# Patient Record
Sex: Male | Born: 1965 | Race: White | Hispanic: No | Marital: Single | Smoking: Former smoker
Health system: Southern US, Community
[De-identification: ages and names within clinical notes are randomized; demographics above are authoritative.]

## PROBLEM LIST (undated history)

## (undated) DIAGNOSIS — I509 Heart failure, unspecified: Secondary | ICD-10-CM

## (undated) DIAGNOSIS — I10 Essential (primary) hypertension: Secondary | ICD-10-CM

## (undated) DIAGNOSIS — I513 Intracardiac thrombosis, not elsewhere classified: Secondary | ICD-10-CM

## (undated) HISTORY — PX: CARDIAC CATHETERIZATION: SHX172

## (undated) HISTORY — DX: Intracardiac thrombosis, not elsewhere classified: I51.3

## (undated) HISTORY — DX: Heart failure, unspecified: I50.9

## (undated) HISTORY — DX: Essential (primary) hypertension: I10

---

## 2017-10-27 ENCOUNTER — Other Ambulatory Visit: Payer: Self-pay

## 2017-10-27 ENCOUNTER — Emergency Department: Payer: Self-pay

## 2017-10-27 ENCOUNTER — Inpatient Hospital Stay
Admission: EM | Admit: 2017-10-27 | Discharge: 2017-10-29 | DRG: 286 | Disposition: A | Discharge status: Home / Self Care | Payer: Self-pay | Attending: Internal Medicine | Admitting: Internal Medicine

## 2017-10-27 DIAGNOSIS — J810 Acute pulmonary edema: Secondary | ICD-10-CM

## 2017-10-27 DIAGNOSIS — I509 Heart failure, unspecified: Secondary | ICD-10-CM

## 2017-10-27 DIAGNOSIS — I420 Dilated cardiomyopathy: Secondary | ICD-10-CM | POA: Diagnosis present

## 2017-10-27 DIAGNOSIS — I081 Rheumatic disorders of both mitral and tricuspid valves: Secondary | ICD-10-CM | POA: Diagnosis present

## 2017-10-27 DIAGNOSIS — I513 Intracardiac thrombosis, not elsewhere classified: Secondary | ICD-10-CM | POA: Diagnosis present

## 2017-10-27 DIAGNOSIS — R188 Other ascites: Secondary | ICD-10-CM

## 2017-10-27 DIAGNOSIS — I5021 Acute systolic (congestive) heart failure: Secondary | ICD-10-CM | POA: Diagnosis present

## 2017-10-27 DIAGNOSIS — E785 Hyperlipidemia, unspecified: Secondary | ICD-10-CM | POA: Diagnosis present

## 2017-10-27 DIAGNOSIS — I110 Hypertensive heart disease with heart failure: Principal | ICD-10-CM | POA: Diagnosis present

## 2017-10-27 DIAGNOSIS — R000 Tachycardia, unspecified: Secondary | ICD-10-CM | POA: Diagnosis present

## 2017-10-27 DIAGNOSIS — Z8249 Family history of ischemic heart disease and other diseases of the circulatory system: Secondary | ICD-10-CM

## 2017-10-27 LAB — CBC WITH DIFFERENTIAL/PLATELET
Basophils Absolute: 0.1 10*3/uL (ref 0–0.1)
Basophils Relative: 1 %
EOS PCT: 3 %
Eosinophils Absolute: 0.2 10*3/uL (ref 0–0.7)
HEMATOCRIT: 47.8 % (ref 40.0–52.0)
Hemoglobin: 16 g/dL (ref 13.0–18.0)
LYMPHS ABS: 2.1 10*3/uL (ref 1.0–3.6)
LYMPHS PCT: 26 %
MCH: 30.2 pg (ref 26.0–34.0)
MCHC: 33.4 g/dL (ref 32.0–36.0)
MCV: 90.4 fL (ref 80.0–100.0)
MONO ABS: 0.5 10*3/uL (ref 0.2–1.0)
Monocytes Relative: 7 %
NEUTROS PCT: 63 %
Neutro Abs: 5 10*3/uL (ref 1.4–6.5)
PLATELETS: 313 10*3/uL (ref 150–440)
RBC: 5.29 MIL/uL (ref 4.40–5.90)
RDW: 14.9 % — AB (ref 11.5–14.5)
WBC: 7.9 10*3/uL (ref 3.8–10.6)

## 2017-10-27 LAB — URINALYSIS, COMPLETE (UACMP) WITH MICROSCOPIC
BACTERIA UA: NONE SEEN
BILIRUBIN URINE: NEGATIVE
Glucose, UA: NEGATIVE mg/dL
Hgb urine dipstick: NEGATIVE
Ketones, ur: NEGATIVE mg/dL
Leukocytes, UA: NEGATIVE
NITRITE: NEGATIVE
PH: 6 (ref 5.0–8.0)
Protein, ur: NEGATIVE mg/dL
SPECIFIC GRAVITY, URINE: 1.019 (ref 1.005–1.030)
Squamous Epithelial / LPF: NONE SEEN (ref 0–5)

## 2017-10-27 LAB — COMPREHENSIVE METABOLIC PANEL
ALK PHOS: 104 U/L (ref 38–126)
ALT: 19 U/L (ref 17–63)
AST: 31 U/L (ref 15–41)
Albumin: 4.2 g/dL (ref 3.5–5.0)
Anion gap: 8 (ref 5–15)
BUN: 7 mg/dL (ref 6–20)
CALCIUM: 8.6 mg/dL — AB (ref 8.9–10.3)
CHLORIDE: 97 mmol/L — AB (ref 101–111)
CO2: 28 mmol/L (ref 22–32)
CREATININE: 1.11 mg/dL (ref 0.61–1.24)
Glucose, Bld: 116 mg/dL — ABNORMAL HIGH (ref 65–99)
Potassium: 4.2 mmol/L (ref 3.5–5.1)
Sodium: 133 mmol/L — ABNORMAL LOW (ref 135–145)
Total Bilirubin: 1.8 mg/dL — ABNORMAL HIGH (ref 0.3–1.2)
Total Protein: 7.5 g/dL (ref 6.5–8.1)

## 2017-10-27 LAB — PROTIME-INR
INR: 1.25
Prothrombin Time: 15.6 seconds — ABNORMAL HIGH (ref 11.4–15.2)

## 2017-10-27 LAB — LIPASE, BLOOD: Lipase: 28 U/L (ref 11–51)

## 2017-10-27 LAB — BRAIN NATRIURETIC PEPTIDE: B Natriuretic Peptide: 1971 pg/mL — ABNORMAL HIGH (ref 0.0–100.0)

## 2017-10-27 MED ORDER — MORPHINE SULFATE (PF) 4 MG/ML IV SOLN
4.0000 mg | Freq: Once | INTRAVENOUS | Status: AC
  Administered 2017-10-27: 4 mg via INTRAVENOUS
  Filled 2017-10-27: qty 1

## 2017-10-27 MED ORDER — IOPAMIDOL (ISOVUE-300) INJECTION 61%
30.0000 mL | Freq: Once | INTRAVENOUS | Status: AC | PRN
  Administered 2017-10-27: 30 mL via ORAL

## 2017-10-27 MED ORDER — SODIUM CHLORIDE 0.9 % IV BOLUS
1000.0000 mL | Freq: Once | INTRAVENOUS | Status: AC
  Administered 2017-10-27: 1000 mL via INTRAVENOUS

## 2017-10-27 MED ORDER — IOPAMIDOL (ISOVUE-300) INJECTION 61%: 15.0000 mL | INTRAVENOUS | Status: DC

## 2017-10-27 MED ORDER — FUROSEMIDE 10 MG/ML IJ SOLN
40.0000 mg | Freq: Once | INTRAMUSCULAR | Status: AC
  Administered 2017-10-27: 40 mg via INTRAVENOUS
  Filled 2017-10-27: qty 4

## 2017-10-27 MED ORDER — MORPHINE SULFATE (PF) 4 MG/ML IV SOLN
4.0000 mg | Freq: Once | INTRAVENOUS | Status: AC
  Administered 2017-10-27 (×2): 4 mg via INTRAVENOUS
  Filled 2017-10-27: qty 1

## 2017-10-27 MED ORDER — ONDANSETRON HCL 4 MG/2ML IJ SOLN
4.0000 mg | Freq: Once | INTRAMUSCULAR | Status: AC
  Administered 2017-10-27: 4 mg via INTRAVENOUS
  Filled 2017-10-27: qty 2

## 2017-10-27 MED ORDER — MORPHINE SULFATE (PF) 4 MG/ML IV SOLN
INTRAVENOUS | Status: AC
  Administered 2017-10-27: 4 mg via INTRAVENOUS
  Filled 2017-10-27: qty 1

## 2017-10-27 MED ORDER — IOPAMIDOL (ISOVUE-370) INJECTION 76%
75.0000 mL | Freq: Once | INTRAVENOUS | Status: AC | PRN
  Administered 2017-10-27: 75 mL via INTRAVENOUS

## 2017-10-27 NOTE — ED Notes (Signed)
Report given to Lisa RN

## 2017-10-27 NOTE — ED Notes (Signed)
CT called and informed that patient has finished contrast drink.

## 2017-10-27 NOTE — ED Triage Notes (Signed)
Pt c/o abd pain that radiated into the right side - the pain started 2-4 days ago - he reports the most severe pain is around is umbilicus - pt last had BM today - he reports decreased appetite because "I am not processing it out"

## 2017-10-27 NOTE — H&P (Addendum)
[facility] [place] [facility]   PATIENT NAME: Austin Mckenzie    MR#:  [id number]  DATE OF BIRTH:  1965/12/11  DATE OF ADMISSION:  10/27/2017  PRIMARY CARE PHYSICIAN: Patient, No Pcp Per   REQUESTING/REFERRING PHYSICIAN:   CHIEF COMPLAINT:   Chief Complaint  Patient presents with  . Abdominal Pain    HISTORY OF PRESENT ILLNESS: Kendarious Gudino  is a 52 y.o. male without significant past medical history, not on any medications. He presented to emergency room for abdominal pain, described as constant diffuse pressure, 6 out of 10 in severity, worse around the umbilical area and left lower quadrant.  He reports some associated occasional nausea and loose stools, no vomiting.  No fever or chills, no bleeding.  His symptoms have been going on for the past 2 weeks, gradually getting worse.  Patient cannot identify any factors that could improve or worsen his symptoms. On detailed questioning, he admits to shortness of breath with exertion going on for the past 2 months or so.  Denies chest pain, cough, or lower extremities edema. Blood test done emergency room are remarkable for sodium level of 133, creatinine level of 1.11, glucose level 116, BNP 1,971.  UA is negative for UTI.  First troponin level was elevated at 0.24.  EKG, reviewed by myself, shows sinus tachycardia with heart rate at 103; normal axis and nonspecific ST changes. Chest x-ray, reviewed by myself, shows pulmonary edema.  CT scan of the abdomen, reviewed by myself, shows ascites. At the arrival to emergency room, his blood pressure was elevated at 179/136 and heart rate was elevated at 113.  Patient is admitted for further evaluation and treatment.    PAST MEDICAL HISTORY:  History reviewed. No pertinent past medical history.  PAST SURGICAL HISTORY: History reviewed. No pertinent surgical history.  SOCIAL HISTORY:  Social History   Tobacco Use  . Smoking status: Never Smoker  . Smokeless  tobacco: Never Used  Substance Use Topics  . Alcohol use: Yes    Alcohol/week: 3.6 oz    Types: 6 Cans of beer per week    FAMILY HISTORY: Hypertension in mother.  DRUG ALLERGIES: No Known Allergies  REVIEW OF SYSTEMS:   CONSTITUTIONAL: No fever, but positive for fatigue.  EYES: No blurred or double vision.  EARS, NOSE, AND THROAT: No tinnitus or ear pain.  RESPIRATORY: No cough, wheezing or hemoptysis.  Positive for shortness of breath with exertion. CARDIOVASCULAR: No chest pain, orthopnea, edema.  GASTROINTESTINAL: Positive for abdominal pain and occasional nausea, no vomiting.  GENITOURINARY: No dysuria, hematuria.  ENDOCRINE: No polyuria, nocturia,  HEMATOLOGY: No bleeding SKIN: No rash or lesion. MUSCULOSKELETAL: No joint pain.   NEUROLOGIC: No focal weakness.  PSYCHIATRY: No anxiety or depression.   MEDICATIONS AT HOME:  Prior to Admission medications   Not on File      PHYSICAL EXAMINATION:   VITAL SIGNS: Blood pressure (!) 152/117, pulse (!) 101, temperature 98.2 F (36.8 C), temperature source Oral, resp. rate 13, height  (1.727 m), weight 72.6 kg (160 lb), SpO2 (!) 79 %.  GENERAL:  52 y.o.-year-old patient lying in the bed with mild distress, secondary to abdominal pain.  EYES: Pupils equal, round, reactive to light and accommodation. No scleral icterus. Extraocular muscles intact.  HEENT: Head atraumatic, normocephalic. Oropharynx and nasopharynx clear.  NECK:  Supple, no jugular venous distention. No thyroid enlargement, no tenderness.  LUNGS: Reduced breath sounds bilaterally.  Bibasilar crackles are heard.  No  use of accessory muscles of respiration.  CARDIOVASCULAR: S1, S2 normal. No S3/S4.  ABDOMEN: Soft, but mildly distended and tender with palpation around the umbilical area and left lower quadrant. Bowel sounds present. No organomegaly or mass appreciated.  EXTREMITIES: No pedal edema, cyanosis, or clubbing.  NEUROLOGIC: No focal weakness.   PSYCHIATRIC: The patient is alert and oriented x 3.  SKIN: No obvious rash, lesion, or ulcer.   LABORATORY PANEL:   CBC Recent Labs  Lab 10/27/17 1647  WBC 7.9  HGB 16.0  HCT 47.8  PLT 313  MCV 90.4  MCH 30.2  MCHC 33.4  RDW 14.9*  LYMPHSABS 2.1  MONOABS 0.5  EOSABS 0.2  BASOSABS 0.1   ------------------------------------------------------------------------------------------------------------------  Chemistries  Recent Labs  Lab 10/27/17 1647  NA 133*  K 4.2  CL 97*  CO2 28  GLUCOSE 116*  BUN 7  CREATININE 1.11  CALCIUM 8.6*  AST 31  ALT 19  ALKPHOS 104  BILITOT 1.8*   ------------------------------------------------------------------------------------------------------------------ estimated creatinine clearance is 75.3 mL/min (by C-G formula based on SCr of 1.11 mg/dL). ------------------------------------------------------------------------------------------------------------------ No results for input(s): TSH, T4TOTAL, T3FREE, THYROIDAB in the last 72 hours.  Invalid input(s): FREET3   Coagulation profile Recent Labs  Lab 10/27/17 2038  INR 1.25   ------------------------------------------------------------------------------------------------------------------- No results for input(s): DDIMER in the last 72 hours. -------------------------------------------------------------------------------------------------------------------  Cardiac Enzymes No results for input(s): CKMB, TROPONINI, MYOGLOBIN in the last 168 hours.  Invalid input(s): CK ------------------------------------------------------------------------------------------------------------------ Invalid input(s): POCBNP  ---------------------------------------------------------------------------------------------------------------  Urinalysis    Component Value Date/Time   COLORURINE STRAW (A) 10/27/2017 1647   APPEARANCEUR CLEAR (A) 10/27/2017 1647   LABSPEC 1.019 10/27/2017 1647    PHURINE 6.0 10/27/2017 1647   GLUCOSEU NEGATIVE 10/27/2017 1647   HGBUR NEGATIVE 10/27/2017 1647   BILIRUBINUR NEGATIVE 10/27/2017 1647   KETONESUR NEGATIVE 10/27/2017 1647   PROTEINUR NEGATIVE 10/27/2017 1647   NITRITE NEGATIVE 10/27/2017 1647   LEUKOCYTESUR NEGATIVE 10/27/2017 1647     RADIOLOGY: Dg Chest 2 View  Result Date: 10/27/2017 CLINICAL DATA:  Shortness of breath and abdominal pain EXAM: CHEST - 2 VIEW COMPARISON:  None. FINDINGS: Cardiomegaly with diffuse interstitial opacities, likely pulmonary interstitial edema. No pneumothorax. Small right pleural effusion. No focal airspace consolidation. IMPRESSION: Cardiomegaly and mild interstitial pulmonary edema. Electronically Signed   By: Deatra Robinson M.D.   On: 10/27/2017 20:16   Ct Abdomen Pelvis W Contrast  Result Date: 10/27/2017 CLINICAL DATA:  Abdominal pain radiating to right side. Periumbilical pain. EXAM: CT ABDOMEN AND PELVIS WITH CONTRAST TECHNIQUE: Multidetector CT imaging of the abdomen and pelvis was performed using the standard protocol following bolus administration of intravenous contrast. CONTRAST:  75mL ISOVUE-370 IOPAMIDOL (ISOVUE-370) INJECTION 76% COMPARISON:  None. FINDINGS: LOWER CHEST: Small right pleural effusion with associated atelectasis. HEPATOBILIARY: Small volume perihepatic ascites. No discrete liver lesion. Normal gallbladder. PANCREAS: Normal parenchymal contours without ductal dilatation. No peripancreatic fluid collection. SPLEEN: Normal. ADRENALS/URINARY TRACT: --Adrenal glands: Normal. --Right kidney/ureter: No hydronephrosis, nephroureterolithiasis, perinephric stranding or solid renal mass. --Left kidney/ureter: No hydronephrosis, nephroureterolithiasis, perinephric stranding or solid renal mass. --Urinary bladder: There is diffuse urinary bladder wall thickening with surrounding inflammatory stranding in the extraperitoneal space. STOMACH/BOWEL: --Stomach/Duodenum: No hiatal hernia or other gastric  abnormality. Normal duodenal course. --Small bowel: No dilatation or inflammation. --Colon: No focal abnormality. --Appendix: There is moderate volume fluid in the right paracolic gutter and right lower quadrant. The appendix is not clearly visible, but appears to be of normal caliber, though surrounded by fluid. No free  intraperitoneal air. VASCULAR/LYMPHATIC: Atherosclerotic calcification is present within the non-aneurysmal abdominal aorta, without hemodynamically significant stenosis. No abdominal or pelvic lymphadenopathy. REPRODUCTIVE: No free fluid in the pelvis. MUSCULOSKELETAL. No bony spinal canal stenosis or focal osseous abnormality. T11 hemangioma. OTHER: None. IMPRESSION: 1. Moderate volume free fluid throughout the abdomen, within the perihepatic space, perisplenic space and right-greater-than-left paracolic gutters. There is also retroperitoneal fluid and stranding. The etiology of the fluid is unclear. In the context of moderate thickening of the urinary bladder with surrounding inflammatory change, acute cystitis is a primary consideration. Hypoalbuminemia or chronic liver disease might also account for the intraperitoneal fluid. No focal small bowel or colonic abnormality to suggest gastrointestinal origin. There is a relatively dense stool ball in the rectum, but the surrounding perirectal fat is normal. 2. Small right pleural effusion. Electronically Signed   By: Deatra Robinson M.D.   On: 10/27/2017 19:45    EKG: Orders placed or performed during the hospital encounter of 10/27/17  . ED EKG  . ED EKG  . EKG 12-Lead  . EKG 12-Lead    IMPRESSION AND PLAN:  1.  Acute pulmonary edema.  We will start patient on Lasix IV.  Will check 2D echo.  Continue to monitor on telemetry. 2. NSTEMI.  First troponin level is elevated at 0.24.  This could be related to pulmonary edema.  Patient is currently chest pain-free.  We will continue to monitor patient on telemetry and follow troponin levels,  to rule out ACS.  Will start patient on aspirin. 3.  CHF, likely secondary to uncontrolled hypertension.  Will check 2D echo for further evaluation.  Will consult cardiology for further evaluation and treatment. 4.  Hypertension, new diagnosis.  Patient is not on any medications for this.  Will start lisinopril and Lasix.  We will plan to add Coreg as the next agent. 5.  Ascites, likely secondary to CHF.  Will start patient on IV Lasix and continue to monitor clinically closely. 6.  Abdominal pain likely, secondary to ascites, from CHF.  We will continue supportive treatment with pain medication while treating the underlying disorder.   All the records are reviewed and case discussed with ED provider. Management plans discussed with the patient, and he is in agreement.  CODE STATUS: FULL    TOTAL TIME TAKING CARE OF THIS PATIENT: 45 minutes.    Cammy Copa M.D on 10/27/2017 at 11:28 PM  Between 7am to 6pm - Pager - [phone]  After 6pm go to [email or web address] - password [email or web address]  [facility]  Office  [phone]  CC: Primary care physician; Patient, No Pcp Per

## 2017-10-27 NOTE — ED Provider Notes (Addendum)
[facility] Emergency Department Provider Note ____________________________________________   First MD Initiated Contact with Patient 10/27/17 1807     (approximate)  I have reviewed the triage vital signs and the nursing notes.   HISTORY  Chief Complaint Abdominal Pain    HPI Austin Mckenzie is a 52 y.o. male with no significant past medical history who presents with abdominal pain for the last 2 weeks, initially intermittent, but now constant and worsening over the last 2 to 3 days.  He reports that the pain is somewhat crampy and diffuse but worse in the left lower quadrant and around his umbilicus.  He reports some nausea but no vomiting, and he reports some loose stools.  No prior history of this pain.   History reviewed. No pertinent past medical history.  There are no active problems to display for this patient.   History reviewed. No pertinent surgical history.  Prior to Admission medications   Not on File    Allergies Patient has no known allergies.  No family history on file.  Social History Social History   Tobacco Use  . Smoking status: Never Smoker  . Smokeless tobacco: Never Used  Substance Use Topics  . Alcohol use: Yes    Alcohol/week: 3.6 oz    Types: 6 Cans of beer per week  . Drug use: Never    Review of Systems  Constitutional: No fever. Eyes: No redness. ENT: No sore throat. Cardiovascular: Denies chest pain. Respiratory: Positive for mild shortness of breath. Gastrointestinal: Positive for nausea, no vomiting. Genitourinary: Negative for dysuria or hematuria.  Musculoskeletal: Negative for back pain. Skin: Negative for rash. Neurological: Negative for headache.   ____________________________________________   PHYSICAL EXAM:  VITAL SIGNS: ED Triage Vitals  Enc Vitals Group     BP 10/27/17 1648 (!) 165/120     Pulse Rate 10/27/17 1648 (!) 113     Resp 10/27/17 1648 20     Temp 10/27/17 1648 98.2 F  (36.8 C)     Temp Source 10/27/17 1648 Oral     SpO2 10/27/17 1648 97 %     Weight 10/27/17 1646 160 lb (72.6 kg)     Height 10/27/17 1646  (1.727 m)     Head Circumference --      Peak Flow --      Pain Score 10/27/17 1645 9     Pain Loc --      Pain Edu? --      Excl. in GC? --     Constitutional: Alert and oriented.  Slightly uncomfortable appearing but in no acute distress. Eyes: Conjunctivae are normal.  No scleral icterus. Head: Atraumatic. Nose: No congestion/rhinnorhea. Mouth/Throat: Mucous membranes are somewhat dry. Neck: Normal range of motion.  Cardiovascular:  Good peripheral circulation. Respiratory: Normal respiratory effort.  No retractions. Lungs CTAB. Gastrointestinal: Soft with mild to moderate left lower quadrant and periumbilical tenderness.  Somewhat distended.  Genitourinary: No flank tenderness. Musculoskeletal: No lower extremity edema.  Extremities warm and well perfused.  Neurologic:  Normal speech and language. No gross focal neurologic deficits are appreciated.  Skin:  Skin is warm and dry. No rash noted. Psychiatric: Mood and affect are normal. Speech and behavior are normal.  ____________________________________________   LABS (all labs ordered are listed, but only abnormal results are displayed)  Labs Reviewed  COMPREHENSIVE METABOLIC PANEL - Abnormal; Notable for the following components:      Result Value   Sodium 133 (*)  Chloride 97 (*)    Glucose, Bld 116 (*)    Calcium 8.6 (*)    Total Bilirubin 1.8 (*)    All other components within normal limits  URINALYSIS, COMPLETE (UACMP) WITH MICROSCOPIC - Abnormal; Notable for the following components:   Color, Urine STRAW (*)    APPearance CLEAR (*)    All other components within normal limits  CBC WITH DIFFERENTIAL/PLATELET - Abnormal; Notable for the following components:   RDW 14.9 (*)    All other components within normal limits  PROTIME-INR - Abnormal; Notable for the  following components:   Prothrombin Time 15.6 (*)    All other components within normal limits  BRAIN NATRIURETIC PEPTIDE - Abnormal; Notable for the following components:   B Natriuretic Peptide 1,971.0 (*)    All other components within normal limits  LIPASE, BLOOD  TROPONIN I   ____________________________________________  EKG  ED ECG REPORT I, Dionne Bucy, the attending physician, personally viewed and interpreted this ECG.  Date: 10/27/2017 EKG Time: 2239 Rate: 103 Rhythm: Sinus tachycardia QRS Axis: normal Intervals: Borderline prolonged QT ST/T Wave abnormalities: Lateral T wave inversions Narrative Interpretation: Nonspecific ST changes  ____________________________________________  RADIOLOGY  CXR: Cardiomegaly with interstitial pulmonary edema CT abdomen: Free fluid in the abdomen with no other acute findings  ____________________________________________   PROCEDURES  Procedure(s) performed: No  Procedures  Critical Care performed: No ____________________________________________   INITIAL IMPRESSION / ASSESSMENT AND PLAN / ED COURSE  Pertinent labs & imaging results that were available during my care of the patient were reviewed by me and considered in my medical decision making (see chart for details).  52 year old male with no significant past medical history presents with primarily periumbilical and left lower quadrant abdominal pain over the last 2 weeks but worse in the last several days.  No prior history of this pain.  On exam, the patient is slightly tachycardic and hypertensive.  The other vital signs are normal.  He is uncomfortable but not acutely ill-appearing.  The remainder of the exam is as described above with some abdominal distention and tenderness.  Differential includes diverticulitis, colitis, appendicitis, bowel obstruction, pyelonephritis, or intra-abdominal malignancy.  Plan: IV fluids, analgesia, labs, and CT  abdomen.    ----------------------------------------- 10:22 PM on 10/27/2017 -----------------------------------------  CT revealed some free fluid in the abdomen but no other obvious etiology of the patient's pain.  I consulted Dr. Elenor Legato from general surgery who also reviewed the images and discussed the clinical presentation with me and confirmed that he felt that there was no evidence of a surgical etiology but likely ascites secondary to some other primary cause.  Patient's chest x-ray showed cardiomegaly and pulmonary interstitial edema, and his BNP is significantly elevated.  Therefore, the abdominal distention and ascites are most likely secondary to CHF.  We have given IV Lasix and added on troponin and EKG.  I had an extensive discussion with the patient his family members about the results of the work-up.  I will admit the patient for work-up and treatment of likely new onset CHF.  There is no clinical evidence of ACS at this time.  I signed the patient out to the hospitalist Dr. Caryn Bee.  ____________________________________________   FINAL CLINICAL IMPRESSION(S) / ED DIAGNOSES  Final diagnoses:  Other ascites  Acute pulmonary edema (HCC)      NEW MEDICATIONS STARTED DURING THIS VISIT:  New Prescriptions   No medications on file     Note:  This document was prepared using  [employer] and may include unintentional dictation errors.      Dionne Bucy, MD 10/27/17 2242

## 2017-10-28 ENCOUNTER — Encounter
Admission: EM | Disposition: A | Discharge status: Home / Self Care | Payer: Self-pay | Source: Home / Self Care | Attending: Internal Medicine

## 2017-10-28 ENCOUNTER — Inpatient Hospital Stay
Admit: 2017-10-28 | Discharge: 2017-10-28 | Disposition: A | Discharge status: Home / Self Care | Payer: Self-pay | Attending: Internal Medicine | Admitting: Internal Medicine

## 2017-10-28 ENCOUNTER — Encounter: Payer: Self-pay | Admitting: Internal Medicine

## 2017-10-28 HISTORY — PX: LEFT HEART CATH AND CORONARY ANGIOGRAPHY: CATH118249

## 2017-10-28 LAB — CBC
HEMATOCRIT: 47.7 % (ref 40.0–52.0)
Hemoglobin: 16.1 g/dL (ref 13.0–18.0)
MCH: 30.4 pg (ref 26.0–34.0)
MCHC: 33.7 g/dL (ref 32.0–36.0)
MCV: 90.3 fL (ref 80.0–100.0)
Platelets: 305 10*3/uL (ref 150–440)
RBC: 5.29 MIL/uL (ref 4.40–5.90)
RDW: 14.9 % — ABNORMAL HIGH (ref 11.5–14.5)
WBC: 7.6 10*3/uL (ref 3.8–10.6)

## 2017-10-28 LAB — ECHOCARDIOGRAM COMPLETE
Height: 68 in
WEIGHTICAEL: 2643.2 [oz_av]

## 2017-10-28 LAB — GLUCOSE, CAPILLARY: GLUCOSE-CAPILLARY: 105 mg/dL — AB (ref 65–99)

## 2017-10-28 LAB — BASIC METABOLIC PANEL
Anion gap: 10 (ref 5–15)
BUN: 8 mg/dL (ref 6–20)
CHLORIDE: 97 mmol/L — AB (ref 101–111)
CO2: 30 mmol/L (ref 22–32)
Calcium: 8.5 mg/dL — ABNORMAL LOW (ref 8.9–10.3)
Creatinine, Ser: 1.05 mg/dL (ref 0.61–1.24)
GFR calc non Af Amer: >60 mL/min (ref >60)
Glucose, Bld: 105 mg/dL — ABNORMAL HIGH (ref 65–99)
POTASSIUM: 3.5 mmol/L (ref 3.5–5.1)
Sodium: 137 mmol/L (ref 135–145)

## 2017-10-28 LAB — TROPONIN I: TROPONIN I: 0.24 ng/mL — AB (ref <0.03)

## 2017-10-28 SURGERY — LEFT HEART CATH AND CORONARY ANGIOGRAPHY
Anesthesia: Moderate Sedation

## 2017-10-28 MED ORDER — LISINOPRIL 5 MG PO TABS
5.0000 mg | ORAL_TABLET | Freq: Every day | ORAL | Status: DC
  Administered 2017-10-28: 5 mg via ORAL
  Filled 2017-10-28: qty 1

## 2017-10-28 MED ORDER — ASPIRIN EC 325 MG PO TBEC
325.0000 mg | DELAYED_RELEASE_TABLET | Freq: Every day | ORAL | Status: DC
  Administered 2017-10-28 – 2017-10-29 (×2): 325 mg via ORAL
  Filled 2017-10-28 (×2): qty 1

## 2017-10-28 MED ORDER — ATORVASTATIN CALCIUM 20 MG PO TABS
40.0000 mg | ORAL_TABLET | Freq: Every day | ORAL | Status: DC
  Administered 2017-10-28: 40 mg via ORAL
  Filled 2017-10-28: qty 2

## 2017-10-28 MED ORDER — SODIUM CHLORIDE 0.9 % WEIGHT BASED INFUSION
1.0000 mL/kg/h | INTRAVENOUS | Status: DC
  Administered 2017-10-28: 1 mL/kg/h via INTRAVENOUS

## 2017-10-28 MED ORDER — HYDRALAZINE HCL 20 MG/ML IJ SOLN
10.0000 mg | Freq: Four times a day (QID) | INTRAMUSCULAR | Status: DC | PRN
  Administered 2017-10-28: 10 mg via INTRAVENOUS
  Filled 2017-10-28: qty 1

## 2017-10-28 MED ORDER — MIDAZOLAM HCL 2 MG/2ML IJ SOLN
INTRAMUSCULAR | Status: AC
  Filled 2017-10-28: qty 2

## 2017-10-28 MED ORDER — ACETAMINOPHEN 325 MG PO TABS: 650.0000 mg | ORAL_TABLET | ORAL | Status: DC | PRN

## 2017-10-28 MED ORDER — SPIRONOLACTONE 25 MG PO TABS
25.0000 mg | ORAL_TABLET | Freq: Every day | ORAL | Status: DC
Start: 2017-10-28 — End: 2017-10-29
  Administered 2017-10-28 – 2017-10-29 (×2): 25 mg via ORAL
  Filled 2017-10-28 (×2): qty 1

## 2017-10-28 MED ORDER — MIDAZOLAM HCL 2 MG/2ML IJ SOLN
INTRAMUSCULAR | Status: DC | PRN
  Administered 2017-10-28: 1 mg via INTRAVENOUS

## 2017-10-28 MED ORDER — SODIUM CHLORIDE 0.9% FLUSH: 3.0000 mL | INTRAVENOUS | Status: DC | PRN

## 2017-10-28 MED ORDER — APIXABAN 5 MG PO TABS
5.0000 mg | ORAL_TABLET | Freq: Two times a day (BID) | ORAL | Status: DC
  Administered 2017-10-28 – 2017-10-29 (×3): 5 mg via ORAL
  Filled 2017-10-28 (×3): qty 1

## 2017-10-28 MED ORDER — SODIUM CHLORIDE 0.9% FLUSH
3.0000 mL | Freq: Two times a day (BID) | INTRAVENOUS | Status: DC
  Administered 2017-10-28: 3 mL via INTRAVENOUS

## 2017-10-28 MED ORDER — LISINOPRIL 10 MG PO TABS
10.0000 mg | ORAL_TABLET | Freq: Every day | ORAL | Status: DC
  Administered 2017-10-29: 10 mg via ORAL
  Filled 2017-10-28: qty 1

## 2017-10-28 MED ORDER — HEPARIN SODIUM (PORCINE) 5000 UNIT/ML IJ SOLN
5000.0000 [IU] | Freq: Three times a day (TID) | INTRAMUSCULAR | Status: DC
  Administered 2017-10-28: 5000 [IU] via SUBCUTANEOUS
  Filled 2017-10-28: qty 1

## 2017-10-28 MED ORDER — FENTANYL CITRATE (PF) 100 MCG/2ML IJ SOLN
INTRAMUSCULAR | Status: DC | PRN
  Administered 2017-10-28: 25 ug via INTRAVENOUS

## 2017-10-28 MED ORDER — ASPIRIN 81 MG PO CHEW: 81.0000 mg | CHEWABLE_TABLET | ORAL | Status: DC

## 2017-10-28 MED ORDER — SODIUM CHLORIDE 0.9 % WEIGHT BASED INFUSION: 1.0000 mL/kg/h | INTRAVENOUS | Status: DC

## 2017-10-28 MED ORDER — ONDANSETRON HCL 4 MG/2ML IJ SOLN: 4.0000 mg | Freq: Four times a day (QID) | INTRAMUSCULAR | Status: DC | PRN

## 2017-10-28 MED ORDER — LABETALOL HCL 5 MG/ML IV SOLN
INTRAVENOUS | Status: AC
  Filled 2017-10-28: qty 4

## 2017-10-28 MED ORDER — ACETAMINOPHEN 325 MG PO TABS: 650.0000 mg | ORAL_TABLET | Freq: Four times a day (QID) | ORAL | Status: DC | PRN

## 2017-10-28 MED ORDER — HYDROCODONE-ACETAMINOPHEN 5-325 MG PO TABS
1.0000 | ORAL_TABLET | ORAL | Status: DC | PRN
  Administered 2017-10-28 – 2017-10-29 (×3): 2 via ORAL
  Filled 2017-10-28 (×3): qty 2

## 2017-10-28 MED ORDER — ONDANSETRON HCL 4 MG PO TABS: 4.0000 mg | ORAL_TABLET | Freq: Four times a day (QID) | ORAL | Status: DC | PRN

## 2017-10-28 MED ORDER — BISACODYL 5 MG PO TBEC: 5.0000 mg | DELAYED_RELEASE_TABLET | Freq: Every day | ORAL | Status: DC | PRN

## 2017-10-28 MED ORDER — ACETAMINOPHEN 650 MG RE SUPP: 650.0000 mg | Freq: Four times a day (QID) | RECTAL | Status: DC | PRN

## 2017-10-28 MED ORDER — FENTANYL CITRATE (PF) 100 MCG/2ML IJ SOLN
INTRAMUSCULAR | Status: AC
  Filled 2017-10-28: qty 2

## 2017-10-28 MED ORDER — LABETALOL HCL 5 MG/ML IV SOLN
INTRAVENOUS | Status: DC | PRN
  Administered 2017-10-28: 20 mg via INTRAVENOUS

## 2017-10-28 MED ORDER — FUROSEMIDE 10 MG/ML IJ SOLN
40.0000 mg | Freq: Two times a day (BID) | INTRAMUSCULAR | Status: DC
  Administered 2017-10-28 – 2017-10-29 (×4): 40 mg via INTRAVENOUS
  Filled 2017-10-28 (×4): qty 4

## 2017-10-28 MED ORDER — DOCUSATE SODIUM 100 MG PO CAPS
100.0000 mg | ORAL_CAPSULE | Freq: Two times a day (BID) | ORAL | Status: DC
  Administered 2017-10-28 – 2017-10-29 (×3): 100 mg via ORAL
  Filled 2017-10-28 (×3): qty 1

## 2017-10-28 MED ORDER — CARVEDILOL 6.25 MG PO TABS
6.2500 mg | ORAL_TABLET | Freq: Two times a day (BID) | ORAL | Status: DC
  Administered 2017-10-28 – 2017-10-29 (×2): 6.25 mg via ORAL
  Filled 2017-10-28 (×2): qty 1

## 2017-10-28 MED ORDER — SODIUM CHLORIDE 0.9 % IV SOLN: 250.0000 mL | INTRAVENOUS | Status: DC | PRN

## 2017-10-28 MED ORDER — SODIUM CHLORIDE 0.9 % WEIGHT BASED INFUSION
3.0000 mL/kg/h | INTRAVENOUS | Status: DC
  Administered 2017-10-28: 3 mL/kg/h via INTRAVENOUS

## 2017-10-28 SURGICAL SUPPLY — 9 items
CATH INFINITI 5FR ANG PIGTAIL (CATHETERS) ×3 IMPLANT
CATH INFINITI 5FR JL4 (CATHETERS) ×3 IMPLANT
CATH INFINITI 5FR JL5 (CATHETERS) ×3 IMPLANT
CATH INFINITI JR4 5F (CATHETERS) ×3 IMPLANT
KIT MANI 3VAL PERCEP (MISCELLANEOUS) ×3 IMPLANT
NEEDLE PERC 18GX7CM (NEEDLE) ×3 IMPLANT
PACK CARDIAC CATH (CUSTOM PROCEDURE TRAY) ×3 IMPLANT
SHEATH PINNACLE 5F 10CM (SHEATH) ×3 IMPLANT
WIRE GUIDERIGHT .035X150 (WIRE) ×3 IMPLANT

## 2017-10-28 NOTE — Progress Notes (Signed)
[facility] [place] [facility]   PATIENT NAME: Austin Mckenzie    MR#:  [id number]  DATE OF BIRTH:  1966-03-23  SUBJECTIVE:  CHIEF COMPLAINT:   Chief Complaint  Patient presents with  . Abdominal Pain   -2 to 3-week history of abdominal distention and dyspnea. -No prior known cardiac disease.  Elevated troponin.  Echo and cardiac cath today  REVIEW OF SYSTEMS:  Review of Systems  Constitutional: Positive for malaise/fatigue. Negative for chills and fever.  HENT: Negative for congestion, ear discharge, hearing loss and nosebleeds.   Respiratory: Positive for shortness of breath. Negative for cough and wheezing.   Cardiovascular: Negative for chest pain, palpitations and leg swelling.  Gastrointestinal: Negative for abdominal pain, constipation, diarrhea, nausea and vomiting.       Abdominal swelling  Genitourinary: Negative for dysuria.  Musculoskeletal: Negative for myalgias.  Neurological: Negative for dizziness, focal weakness, seizures, weakness and headaches.  Psychiatric/Behavioral: Negative for depression.    DRUG ALLERGIES:  No Known Allergies  VITALS:  Blood pressure (!) 152/104, pulse 95, temperature 97.9 F (36.6 C), temperature source Oral, resp. rate 18, height  (1.727 m), weight 74.9 kg (165 lb 3.2 oz), SpO2 94 %.  PHYSICAL EXAMINATION:  Physical Exam  GENERAL:  52 y.o.-year-old patient lying in the bed with no acute distress.  EYES: Pupils equal, round, reactive to light and accommodation. No scleral icterus. Extraocular muscles intact.  HEENT: Head atraumatic, normocephalic. Oropharynx and nasopharynx clear.  NECK:  Supple, no jugular venous distention. No thyroid enlargement, no tenderness.  LUNGS: Normal breath sounds bilaterally, no wheezing, rales,rhonchi or crepitation. No use of accessory muscles of respiration.  Decreased bibasilar breath sounds CARDIOVASCULAR: S1, S2 regular and rapid. No murmurs, rubs, or gallops.    ABDOMEN: Soft, nontender, nondistended. Bowel sounds present. No organomegaly or mass.  EXTREMITIES: No pedal edema, cyanosis, or clubbing.  NEUROLOGIC: Cranial nerves II through XII are intact. Muscle strength 5/5 in all extremities. Sensation intact. Gait not checked.  PSYCHIATRIC: The patient is alert and oriented x 3.  SKIN: No obvious rash, lesion, or ulcer.    LABORATORY PANEL:   CBC Recent Labs  Lab 10/28/17 0722  WBC 7.6  HGB 16.1  HCT 47.7  PLT 305   ------------------------------------------------------------------------------------------------------------------  Chemistries  Recent Labs  Lab 10/27/17 1647 10/28/17 0722  NA 133* 137  K 4.2 3.5  CL 97* 97*  CO2 28 30  GLUCOSE 116* 105*  BUN 7 8  CREATININE 1.11 1.05  CALCIUM 8.6* 8.5*  AST 31  --   ALT 19  --   ALKPHOS 104  --   BILITOT 1.8*  --    ------------------------------------------------------------------------------------------------------------------  Cardiac Enzymes Recent Labs  Lab 10/27/17 2253  TROPONINI 0.24*   ------------------------------------------------------------------------------------------------------------------  RADIOLOGY:  Dg Chest 2 View  Result Date: 10/27/2017 CLINICAL DATA:  Shortness of breath and abdominal pain EXAM: CHEST - 2 VIEW COMPARISON:  None. FINDINGS: Cardiomegaly with diffuse interstitial opacities, likely pulmonary interstitial edema. No pneumothorax. Small right pleural effusion. No focal airspace consolidation. IMPRESSION: Cardiomegaly and mild interstitial pulmonary edema. Electronically Signed   By: Deatra Robinson M.D.   On: 10/27/2017 20:16   Ct Abdomen Pelvis W Contrast  Result Date: 10/27/2017 CLINICAL DATA:  Abdominal pain radiating to right side. Periumbilical pain. EXAM: CT ABDOMEN AND PELVIS WITH CONTRAST TECHNIQUE: Multidetector CT imaging of the abdomen and pelvis was performed using the standard protocol following bolus administration of  intravenous contrast. CONTRAST:  75mL  ISOVUE-370 IOPAMIDOL (ISOVUE-370) INJECTION 76% COMPARISON:  None. FINDINGS: LOWER CHEST: Small right pleural effusion with associated atelectasis. HEPATOBILIARY: Small volume perihepatic ascites. No discrete liver lesion. Normal gallbladder. PANCREAS: Normal parenchymal contours without ductal dilatation. No peripancreatic fluid collection. SPLEEN: Normal. ADRENALS/URINARY TRACT: --Adrenal glands: Normal. --Right kidney/ureter: No hydronephrosis, nephroureterolithiasis, perinephric stranding or solid renal mass. --Left kidney/ureter: No hydronephrosis, nephroureterolithiasis, perinephric stranding or solid renal mass. --Urinary bladder: There is diffuse urinary bladder wall thickening with surrounding inflammatory stranding in the extraperitoneal space. STOMACH/BOWEL: --Stomach/Duodenum: No hiatal hernia or other gastric abnormality. Normal duodenal course. --Small bowel: No dilatation or inflammation. --Colon: No focal abnormality. --Appendix: There is moderate volume fluid in the right paracolic gutter and right lower quadrant. The appendix is not clearly visible, but appears to be of normal caliber, though surrounded by fluid. No free intraperitoneal air. VASCULAR/LYMPHATIC: Atherosclerotic calcification is present within the non-aneurysmal abdominal aorta, without hemodynamically significant stenosis. No abdominal or pelvic lymphadenopathy. REPRODUCTIVE: No free fluid in the pelvis. MUSCULOSKELETAL. No bony spinal canal stenosis or focal osseous abnormality. T11 hemangioma. OTHER: None. IMPRESSION: 1. Moderate volume free fluid throughout the abdomen, within the perihepatic space, perisplenic space and right-greater-than-left paracolic gutters. There is also retroperitoneal fluid and stranding. The etiology of the fluid is unclear. In the context of moderate thickening of the urinary bladder with surrounding inflammatory change, acute cystitis is a primary consideration.  Hypoalbuminemia or chronic liver disease might also account for the intraperitoneal fluid. No focal small bowel or colonic abnormality to suggest gastrointestinal origin. There is a relatively dense stool ball in the rectum, but the surrounding perirectal fat is normal. 2. Small right pleural effusion. Electronically Signed   By: Deatra Robinson M.D.   On: 10/27/2017 19:45    EKG:   Orders placed or performed during the hospital encounter of 10/27/17  . ED EKG  . ED EKG  . EKG 12-Lead  . EKG 12-Lead    ASSESSMENT AND PLAN:   52 year old male with no known past medical history presents to hospital secondary to worsening dyspnea and abdominal distention going on for 2 weeks now.  1.  Acute congestive heart failure-no known prior history. -Echocardiogram is pending.  BNP significantly elevated -Continue IV Lasix for now -Appreciate cardiology consult  2.  Elevated troponin-with congestive heart failure new onset, ischemic heart disease needs to be ruled out -Appreciate cardiology consult.  Started on Aspirin, carvedilol and statin -For cardiac catheterization today  3.  Ascites-liver morphology appears normal.  Could be from congestive heart failure -If Lasix does not improve with abdominal distention, will consider ultrasound-guided paracentesis  4.  Hypertension-we will add cardiac medications-Coreg, lisinopril and IV hydralazine PRN for now.  Patient already on Lasix  5.  DVT prophylaxis-subcutaneous heparin   Patient is ambulatory  All the records are reviewed and case discussed with Care Management/Social Workerr. Management plans discussed with the patient, family and they are in agreement.  CODE STATUS: Full code  TOTAL TIME TAKING CARE OF THIS PATIENT: 33 minutes.   POSSIBLE D/C IN 1-2 DAYS, DEPENDING ON CLINICAL CONDITION.   Enid Baas M.D on 10/28/2017 at 10:38 AM  Between 7am to 6pm - Pager - [phone]  After 6pm go to [email or web address] - password [email or web address]  [facility]  Office  [phone]  CC: Primary care physician; Patient, No Pcp Per

## 2017-10-28 NOTE — Progress Notes (Signed)
*  PRELIMINARY RESULTS* Echocardiogram 2D Echocardiogram has been performed.  Joanette Gula Wahneta Derocher 10/28/2017, 9:45 AM

## 2017-10-28 NOTE — Plan of Care (Signed)

## 2017-10-28 NOTE — Consult Note (Signed)
[facility] Consultation Note  Patient ID: Austin Mckenzie, MRN: [id number], DOB/AGE: 08-01-1965 52 y.o. Admit date: 10/27/2017   Date of Consult: 10/28/2017 Primary Physician: Patient, No Pcp Per Primary Cardiologist: None  Chief Complaint:  Chief Complaint  Patient presents with  . Abdominal Pain   Reason for Consult: Failure  HPI: 52 y.o. male with borderline hypertension in the past but no other for further cardiovascular history but a family history of cardiovascular disease having worsening shortness of breath weakness and fatigue with and without physical activity and abdominal bloating and other concerns of GI distress.  The patient has not been able to mow his lawn over the last several weeks due to significant shortness of breath.  He does not have any particular PND or orthopnea at this time.  When arriving to the emergency room the patient does have some pulmonary edema and malignant hypertension with a systolic blood pressure 160 systolic.  Troponin level 0.24 and BNP of 1962.  EKG shows normal sinus rhythm with left ventricular hypertrophy and lateral T wave inversions.  Echocardiogram has been performed showing severe dilation of left ventricle with ejection fraction of approximately 15% and an apical thrombus.  The patient has biatrial enlargement with mild to moderate mitral and tricuspid regurgitation.  The patient was given some intravenous Lasix which has had helped somewhat.  Currently he is hemodynamically stable  History reviewed. No pertinent past medical history.    Surgical History: History reviewed. No pertinent surgical history.   Home Meds: Prior to Admission medications   Not on File    Inpatient Medications:  . [START ON 10/29/2017] aspirin  81 mg Oral Pre-Cath  . aspirin EC  325 mg Oral Daily  . atorvastatin  40 mg Oral q1800  . carvedilol  6.25 mg Oral BID WC  . docusate sodium  100 mg Oral BID  . furosemide  40 mg Intravenous Q12H  . heparin   5,000 Units Subcutaneous Q8H  . [START ON 10/29/2017] lisinopril  10 mg Oral Daily  . sodium chloride flush  3 mL Intravenous Q12H   . sodium chloride    . [START ON 10/29/2017] sodium chloride     Followed by  . [START ON 10/29/2017] sodium chloride      Allergies: No Known Allergies  Social History   Socioeconomic History  . Marital status: Single    Spouse name: Not on file  . Number of children: Not on file  . Years of education: Not on file  . Highest education level: Not on file  Occupational History  . Not on file  Social Needs  . Financial resource strain: Not on file  . Food insecurity:    Worry: Not on file    Inability: Not on file  . Transportation needs:    Medical: Not on file    Non-medical: Not on file  Tobacco Use  . Smoking status: Never Smoker  . Smokeless tobacco: Never Used  Substance and Sexual Activity  . Alcohol use: Yes    Alcohol/week: 3.6 oz    Types: 6 Cans of beer per week  . Drug use: Never  . Sexual activity: Not on file  Lifestyle  . Physical activity:    Days per week: Not on file    Minutes per session: Not on file  . Stress: Not on file  Relationships  . Social connections:    Talks on phone: Not on file    Gets together: Not on file  Attends religious service: Not on file    Active member of club or organization: Not on file    Attends meetings of clubs or organizations: Not on file    Relationship status: Not on file  . Intimate partner violence:    Fear of current or ex partner: Not on file    Emotionally abused: Not on file    Physically abused: Not on file    Forced sexual activity: Not on file  Other Topics Concern  . Not on file  Social History Narrative  . Not on file     No family history on file.   Review of Systems Positive for this of breath weakness fatigue Negative for: General:  chills, fever, night sweats or weight changes.  Cardiovascular: PND orthopnea syncope dizziness  Dermatological skin  lesions rashes Respiratory: Cough congestion Urologic: Frequent urination urination at night and hematuria Abdominal: negative for nausea, vomiting, diarrhea, bright red blood per rectum, melena, or hematemesis Neurologic: negative for visual changes, and/or hearing changes  All other systems reviewed and are otherwise negative except as noted above.  Labs: Recent Labs    10/27/17 2253  TROPONINI 0.24*   Lab Results  Component Value Date   WBC 7.6 10/28/2017   HGB 16.1 10/28/2017   HCT 47.7 10/28/2017   MCV 90.3 10/28/2017   PLT 305 10/28/2017    Recent Labs  Lab 10/27/17 1647 10/28/17 0722  NA 133* 137  K 4.2 3.5  CL 97* 97*  CO2 28 30  BUN 7 8  CREATININE 1.11 1.05  CALCIUM 8.6* 8.5*  PROT 7.5  --   BILITOT 1.8*  --   ALKPHOS 104  --   ALT 19  --   AST 31  --   GLUCOSE 116* 105*   No results found for: CHOL, HDL, LDLCALC, TRIG No results found for: DDIMER  Radiology/Studies:  Dg Chest 2 View  Result Date: 10/27/2017 CLINICAL DATA:  Shortness of breath and abdominal pain EXAM: CHEST - 2 VIEW COMPARISON:  None. FINDINGS: Cardiomegaly with diffuse interstitial opacities, likely pulmonary interstitial edema. No pneumothorax. Small right pleural effusion. No focal airspace consolidation. IMPRESSION: Cardiomegaly and mild interstitial pulmonary edema. Electronically Signed   By: Deatra Robinson M.D.   On: 10/27/2017 20:16   Ct Abdomen Pelvis W Contrast  Result Date: 10/27/2017 CLINICAL DATA:  Abdominal pain radiating to right side. Periumbilical pain. EXAM: CT ABDOMEN AND PELVIS WITH CONTRAST TECHNIQUE: Multidetector CT imaging of the abdomen and pelvis was performed using the standard protocol following bolus administration of intravenous contrast. CONTRAST:  75mL ISOVUE-370 IOPAMIDOL (ISOVUE-370) INJECTION 76% COMPARISON:  None. FINDINGS: LOWER CHEST: Small right pleural effusion with associated atelectasis. HEPATOBILIARY: Small volume perihepatic ascites. No discrete liver  lesion. Normal gallbladder. PANCREAS: Normal parenchymal contours without ductal dilatation. No peripancreatic fluid collection. SPLEEN: Normal. ADRENALS/URINARY TRACT: --Adrenal glands: Normal. --Right kidney/ureter: No hydronephrosis, nephroureterolithiasis, perinephric stranding or solid renal mass. --Left kidney/ureter: No hydronephrosis, nephroureterolithiasis, perinephric stranding or solid renal mass. --Urinary bladder: There is diffuse urinary bladder wall thickening with surrounding inflammatory stranding in the extraperitoneal space. STOMACH/BOWEL: --Stomach/Duodenum: No hiatal hernia or other gastric abnormality. Normal duodenal course. --Small bowel: No dilatation or inflammation. --Colon: No focal abnormality. --Appendix: There is moderate volume fluid in the right paracolic gutter and right lower quadrant. The appendix is not clearly visible, but appears to be of normal caliber, though surrounded by fluid. No free intraperitoneal air. VASCULAR/LYMPHATIC: Atherosclerotic calcification is present within the non-aneurysmal abdominal aorta, without hemodynamically  significant stenosis. No abdominal or pelvic lymphadenopathy. REPRODUCTIVE: No free fluid in the pelvis. MUSCULOSKELETAL. No bony spinal canal stenosis or focal osseous abnormality. T11 hemangioma. OTHER: None. IMPRESSION: 1. Moderate volume free fluid throughout the abdomen, within the perihepatic space, perisplenic space and right-greater-than-left paracolic gutters. There is also retroperitoneal fluid and stranding. The etiology of the fluid is unclear. In the context of moderate thickening of the urinary bladder with surrounding inflammatory change, acute cystitis is a primary consideration. Hypoalbuminemia or chronic liver disease might also account for the intraperitoneal fluid. No focal small bowel or colonic abnormality to suggest gastrointestinal origin. There is a relatively dense stool ball in the rectum, but the surrounding perirectal  fat is normal. 2. Small right pleural effusion. Electronically Signed   By: Deatra Robinson M.D.   On: 10/27/2017 19:45    EKG: Normal sinus rhythm with left ventricular hypertrophy and lateral T wave inversion  Weights: Filed Weights   10/27/17 1646 10/28/17 0042 10/28/17 0403  Weight: 160 lb (72.6 kg) 167 lb 11.2 oz (76.1 kg) 165 lb 3.2 oz (74.9 kg)     Physical Exam: Blood pressure (!) 152/104, pulse 95, temperature 97.9 F (36.6 C), temperature source Oral, resp. rate 18, height  (1.727 m), weight 165 lb 3.2 oz (74.9 kg), SpO2 94 %. Body mass index is 25.12 kg/m. General: Well developed, well nourished, in no acute distress. Head eyes ears nose throat: Normocephalic, atraumatic, sclera non-icteric, no xanthomas, nares are without discharge. No apparent thyromegaly and/or mass  Lungs: Normal respiratory effort.  no wheezes, some rales, no rhonchi.  Heart: RRR with normal S1 S2. no murmur gallop, no rub, PMI is normal size and placement, carotid upstroke normal without bruit, jugular venous pressure is normal Abdomen: Soft, tender,  distended with normoactive bowel sounds. No hepatomegaly. No rebound/guarding. No obvious abdominal masses. Abdominal aorta is normal size without bruit Extremities: Trace to 1+ edema. no cyanosis, no clubbing, no ulcers  Peripheral : 2+ bilateral upper extremity pulses, 2+ bilateral femoral pulses, 2+ bilateral dorsal pedal pulse Neuro: Alert and oriented. No facial asymmetry. No focal deficit. Moves all extremities spontaneously. Musculoskeletal: Normal muscle tone without kyphosis Psych:  Responds to questions appropriately with a normal affect.    Assessment: 52 year old male with acute systolic dysfunction congestive heart failure with elevated troponin apical thrombus malignant hypertension and hyperlipidemia needing further treatment options  Plan: 1.  Begin beta-blocker carvedilol as well as lisinopril and increase doses as able for better  blood pressure control and cardiomyopathy. 2.  Consideration of addition of spironolactone for class IV heart failure 3.  Erosive mild for pulmonary edema lower extremity edema abdominal bloating 4.  Received 2 cardiac catheterization to assess coronary anatomy and further treatment thereof is necessary as a primary cause of his severe dilated cardiomyopathy.  Patient understands risk and benefits of cardiac catheterization.  This includes a possibility death stroke heart attack infection bleeding or blood clot.  He is low risk for conscious sedation 5.  Further consideration of anticoagulation after patient has invasive assessment of coronary arteries due to apical thrombus  Signed, Lamar Blinks M.D. [facility] Card[facility] 10/28/2017, 11:04 AM

## 2017-10-28 NOTE — Progress Notes (Signed)
ECHO with diffuse hypokinesis-EF of 15% -Echo showing 1 cm apical thrombus, per cardiology recommendations will start on Eliquis

## 2017-10-28 NOTE — Progress Notes (Signed)
[facility] Cardiology [facility] Encounter Note  Patient: Austin Mckenzie / Admit Date: 10/27/2017 / Date of Encounter: 10/28/2017, 1:17 PM   Subjective: Continued high blood pressure despite appropriate medication management.  Some abdominal discomfort still somewhat residual Echocardiogram showing severe dilated cardiomyopathy with severe LV dysfunction with ejection fraction of 15% and 1 cm apical thrombus Cardiac catheterization with coronary arteries showing normal coronary arteries  Review of Systems: Positive for: Abdominal discomfort and shortness of breath Negative for: Vision change, hearing change, syncope, dizziness, nausea, vomiting,diarrhea, bloody stool,  , cough, congestion, diaphoresis, urinary frequency, urinary pain,skin lesions, skin rashes Others previously listed  Objective: Telemetry: Normal sinus rhythm Physical Exam: Blood pressure (!) 145/103, pulse (!) 104, temperature 98.2 F (36.8 C), temperature source Oral, resp. rate 18, height  (1.727 m), weight 165 lb (74.8 kg), SpO2 95 %. Body mass index is 25.09 kg/m. General: Well developed, well nourished, in no acute distress. Head: Normocephalic, atraumatic, sclera non-icteric, no xanthomas, nares are without discharge. Neck: No apparent masses Lungs: Normal respirations with no wheezes, no rhonchi, no rales , basilar crackles   Heart: Regular rate and rhythm, normal S1 S2, no murmur, no rub, no gallop, PMI is normal size and placement, carotid upstroke normal without bruit, jugular venous pressure normal Abdomen: Soft,  tender,  distended with normoactive bowel sounds. No hepatosplenomegaly. Abdominal aorta is normal size without bruit Extremities: Trace to 1+ edema, no clubbing, no cyanosis, no ulcers,  Peripheral: 2+ radial, 2+ femoral, 2+ dorsal pedal pulses Neuro: Alert and oriented. Moves all extremities spontaneously. Psych:  Responds to questions appropriately with a normal affect.   Intake/Output  Summary (Last 24 hours) at 10/28/2017 1317 Last data filed at 10/28/2017 1050 Gross per 24 hour  Intake -  Output 4225 ml  Net -4225 ml    Inpatient Medications:  . [START ON 10/29/2017] aspirin  81 mg Oral Pre-Cath  . [MAR Hold] aspirin EC  325 mg Oral Daily  . [MAR Hold] atorvastatin  40 mg Oral q1800  . [MAR Hold] carvedilol  6.25 mg Oral BID WC  . [MAR Hold] docusate sodium  100 mg Oral BID  . [MAR Hold] furosemide  40 mg Intravenous Q12H  . [MAR Hold] heparin  5,000 Units Subcutaneous Q8H  . [MAR Hold] lisinopril  10 mg Oral Daily  . sodium chloride flush  3 mL Intravenous Q12H   Infusions:  . sodium chloride    . [START ON 10/29/2017] sodium chloride 3 mL/kg/hr (10/28/17 1226)   Followed by  . [START ON 10/29/2017] sodium chloride      Labs: Recent Labs    10/27/17 1647 10/28/17 0722  NA 133* 137  K 4.2 3.5  CL 97* 97*  CO2 28 30  GLUCOSE 116* 105*  BUN 7 8  CREATININE 1.11 1.05  CALCIUM 8.6* 8.5*   Recent Labs    10/27/17 1647  AST 31  ALT 19  ALKPHOS 104  BILITOT 1.8*  PROT 7.5  ALBUMIN 4.2   Recent Labs    10/27/17 1647 10/28/17 0722  WBC 7.9 7.6  NEUTROABS 5.0  --   HGB 16.0 16.1  HCT 47.8 47.7  MCV 90.4 90.3  PLT 313 305   Recent Labs    10/27/17 2253  TROPONINI 0.24*   Invalid input(s): POCBNP No results for input(s): HGBA1C in the last 72 hours.   Weights: Filed Weights   10/28/17 0042 10/28/17 0403 10/28/17 1203  Weight: 167 lb 11.2 oz (76.1 kg) 165 lb  3.2 oz (74.9 kg) 165 lb (74.8 kg)     Radiology/Studies:  Dg Chest 2 View  Result Date: 10/27/2017 CLINICAL DATA:  Shortness of breath and abdominal pain EXAM: CHEST - 2 VIEW COMPARISON:  None. FINDINGS: Cardiomegaly with diffuse interstitial opacities, likely pulmonary interstitial edema. No pneumothorax. Small right pleural effusion. No focal airspace consolidation. IMPRESSION: Cardiomegaly and mild interstitial pulmonary edema. Electronically Signed   By: Deatra Robinson M.D.   On:  10/27/2017 20:16   Ct Abdomen Pelvis W Contrast  Result Date: 10/27/2017 CLINICAL DATA:  Abdominal pain radiating to right side. Periumbilical pain. EXAM: CT ABDOMEN AND PELVIS WITH CONTRAST TECHNIQUE: Multidetector CT imaging of the abdomen and pelvis was performed using the standard protocol following bolus administration of intravenous contrast. CONTRAST:  75mL ISOVUE-370 IOPAMIDOL (ISOVUE-370) INJECTION 76% COMPARISON:  None. FINDINGS: LOWER CHEST: Small right pleural effusion with associated atelectasis. HEPATOBILIARY: Small volume perihepatic ascites. No discrete liver lesion. Normal gallbladder. PANCREAS: Normal parenchymal contours without ductal dilatation. No peripancreatic fluid collection. SPLEEN: Normal. ADRENALS/URINARY TRACT: --Adrenal glands: Normal. --Right kidney/ureter: No hydronephrosis, nephroureterolithiasis, perinephric stranding or solid renal mass. --Left kidney/ureter: No hydronephrosis, nephroureterolithiasis, perinephric stranding or solid renal mass. --Urinary bladder: There is diffuse urinary bladder wall thickening with surrounding inflammatory stranding in the extraperitoneal space. STOMACH/BOWEL: --Stomach/Duodenum: No hiatal hernia or other gastric abnormality. Normal duodenal course. --Small bowel: No dilatation or inflammation. --Colon: No focal abnormality. --Appendix: There is moderate volume fluid in the right paracolic gutter and right lower quadrant. The appendix is not clearly visible, but appears to be of normal caliber, though surrounded by fluid. No free intraperitoneal air. VASCULAR/LYMPHATIC: Atherosclerotic calcification is present within the non-aneurysmal abdominal aorta, without hemodynamically significant stenosis. No abdominal or pelvic lymphadenopathy. REPRODUCTIVE: No free fluid in the pelvis. MUSCULOSKELETAL. No bony spinal canal stenosis or focal osseous abnormality. T11 hemangioma. OTHER: None. IMPRESSION: 1. Moderate volume free fluid throughout the  abdomen, within the perihepatic space, perisplenic space and right-greater-than-left paracolic gutters. There is also retroperitoneal fluid and stranding. The etiology of the fluid is unclear. In the context of moderate thickening of the urinary bladder with surrounding inflammatory change, acute cystitis is a primary consideration. Hypoalbuminemia or chronic liver disease might also account for the intraperitoneal fluid. No focal small bowel or colonic abnormality to suggest gastrointestinal origin. There is a relatively dense stool ball in the rectum, but the surrounding perirectal fat is normal. 2. Small right pleural effusion. Electronically Signed   By: Deatra Robinson M.D.   On: 10/27/2017 19:45     Assessment and Recommendation  52 y.o. male with apparent history of hypertension but no cardiovascular history having acute systolic dysfunction congestive heart failure with apical thrombus normal coronary arteries by cardiac catheterization without evidence of myocardial infarction 1.  Continue aggressive medication management for hypertension control increasing carvedilol to maximum dose of 25 mg twice per day 2.  Continue advancing ACE inhibitor to goal dose of 40 mg each day 3.  Add spironolactone 25 mg each day for cardiomyopathy 4.  Addition of Eliquis 5 mg twice per day for anticoagulation and treatment of apical thrombus 5.  Further assessment and treatment of abdominal discomfort  Signed, Arnoldo Hooker M.D. FACC

## 2017-10-28 NOTE — ED Notes (Signed)
Dr Caryn Bee notified of pt's troponin

## 2017-10-28 NOTE — Progress Notes (Signed)
MD was informed of pt very high BP and order prn meds to be given , will continue to monitor

## 2017-10-29 LAB — BASIC METABOLIC PANEL
Anion gap: 12 (ref 5–15)
BUN: 7 mg/dL (ref 6–20)
CO2: 28 mmol/L (ref 22–32)
Calcium: 7.8 mg/dL — ABNORMAL LOW (ref 8.9–10.3)
Chloride: 92 mmol/L — ABNORMAL LOW (ref 101–111)
Creatinine, Ser: 0.92 mg/dL (ref 0.61–1.24)
GFR calc Af Amer: >60 mL/min (ref >60)
GLUCOSE: 106 mg/dL — AB (ref 65–99)
POTASSIUM: 3 mmol/L — AB (ref 3.5–5.1)
Sodium: 132 mmol/L — ABNORMAL LOW (ref 135–145)

## 2017-10-29 LAB — HIV ANTIBODY (ROUTINE TESTING W REFLEX): HIV SCREEN 4TH GENERATION: NONREACTIVE

## 2017-10-29 LAB — GLUCOSE, CAPILLARY: GLUCOSE-CAPILLARY: 125 mg/dL — AB (ref 65–99)

## 2017-10-29 LAB — MAGNESIUM: MAGNESIUM: 1.4 mg/dL — AB (ref 1.7–2.4)

## 2017-10-29 MED ORDER — CARVEDILOL 6.25 MG PO TABS
6.2500 mg | ORAL_TABLET | Freq: Once | ORAL | Status: AC
  Administered 2017-10-29: 6.25 mg via ORAL
  Filled 2017-10-29: qty 1

## 2017-10-29 MED ORDER — ATORVASTATIN CALCIUM 40 MG PO TABS: 40.0000 mg | ORAL_TABLET | Freq: Every day | ORAL | 2 refills | Status: AC

## 2017-10-29 MED ORDER — APIXABAN 5 MG PO TABS: 5.0000 mg | ORAL_TABLET | Freq: Two times a day (BID) | ORAL | 2 refills | Status: AC

## 2017-10-29 MED ORDER — POTASSIUM CHLORIDE CRYS ER 20 MEQ PO TBCR
40.0000 meq | EXTENDED_RELEASE_TABLET | ORAL | Status: DC
  Administered 2017-10-29: 40 meq via ORAL
  Filled 2017-10-29: qty 2

## 2017-10-29 MED ORDER — POTASSIUM CHLORIDE CRYS ER 20 MEQ PO TBCR
20.0000 meq | EXTENDED_RELEASE_TABLET | Freq: Two times a day (BID) | ORAL | Status: DC
  Administered 2017-10-29: 20 meq via ORAL

## 2017-10-29 MED ORDER — CARVEDILOL 12.5 MG PO TABS: 12.5000 mg | ORAL_TABLET | Freq: Two times a day (BID) | ORAL | Status: DC

## 2017-10-29 MED ORDER — MAGNESIUM SULFATE 2 GM/50ML IV SOLN
2.0000 g | Freq: Once | INTRAVENOUS | Status: AC
  Administered 2017-10-29: 2 g via INTRAVENOUS
  Filled 2017-10-29: qty 50

## 2017-10-29 MED ORDER — CARVEDILOL 12.5 MG PO TABS: 12.5000 mg | ORAL_TABLET | Freq: Two times a day (BID) | ORAL | 2 refills | Status: AC

## 2017-10-29 MED ORDER — POTASSIUM CHLORIDE CRYS ER 20 MEQ PO TBCR: 20.0000 meq | EXTENDED_RELEASE_TABLET | Freq: Every day | ORAL | 2 refills | Status: AC

## 2017-10-29 MED ORDER — FUROSEMIDE 40 MG PO TABS: 40.0000 mg | ORAL_TABLET | Freq: Every day | ORAL | 2 refills | Status: AC

## 2017-10-29 MED ORDER — POTASSIUM CHLORIDE CRYS ER 20 MEQ PO TBCR
40.0000 meq | EXTENDED_RELEASE_TABLET | ORAL | Status: AC
  Administered 2017-10-29 (×2): 40 meq via ORAL
  Filled 2017-10-29 (×2): qty 2

## 2017-10-29 MED ORDER — LISINOPRIL 20 MG PO TABS: 20.0000 mg | ORAL_TABLET | Freq: Every day | ORAL | 2 refills | Status: DC

## 2017-10-29 MED ORDER — SPIRONOLACTONE 25 MG PO TABS: 25.0000 mg | ORAL_TABLET | Freq: Every day | ORAL | 2 refills | Status: AC

## 2017-10-29 NOTE — Progress Notes (Signed)
Pt states no legal guardian.

## 2017-10-29 NOTE — Progress Notes (Addendum)
Cardiovascular and Pulmonary Nurse Navigator Note:    52 year old male with hx of HTN but no prior CV history.  Patient admitted with acute systolic dysfunction CHF with apical thrombus.  EF 15% on echo performed on 10/28/2017.    Austin Mckenzie  CARDIAC CATHETERIZATION  Order# [id number]  Reading physician: Lamar Blinks, MD Ordering physician: Lamar Blinks, MD Study date: 10/28/17  Physicians   Panel Physicians Referring Physician Case Authorizing Physician  Lamar Blinks, MD (Primary)  Lamar Blinks, MD  Procedures   LEFT HEART CATH AND CORONARY ANGIOGRAPHY  Conclusion   Assessment The patient has had Acute systolic dysfunction congestive heart failure with New York Heart Association Class IV symptoms.  abnormal left ventricular function with ejection fraction of 15%  normal coronary arteries with no 3 vessel arterial disease and up to 0% stenosis  Plan Aggressive medical management of congestive heart failure with appropriate use of beta blockers, ACE inhibitors, and diuretics. Congestive heart failure education and rehabilitation have been recommended.    CHF Education:?? ? Provided patient with "Living Better with Heart Failure" packet. Briefly reviewed definition of heart failure and signs and symptoms of an exacerbation.?Discussed the meaning of EF and his value as compared to normal value. Explained to patient that HF is a chronic illness which requires self-assessment / self-management along with help from the cardiologist/PCP/HF Clinic.? ?? *Reviewed importance of and reason behind checking weight daily in the AM, after using the bathroom, but before getting dressed.?Patient has scales.   ? Reviewed the following information with patient:  *Discussed when to call the Dr= weight gain of >2-3lb overnight of 5lb in a week,  *Discussed yellow zone= call MD: weight gain of >2-3lb overnight of 5lb in a week, increased swelling, increased SOB when lying down, chest  discomfort, dizziness, increased fatigue *Red Zone= call 911: struggle to breath, fainting or near fainting, significant chest pain  ? CHF Education:?? ? Provided patient with "Living Better with Heart Failure" packet. Briefly reviewed definition of heart failure and signs and symptoms of an exacerbation.?Discussed the meaning of EF and his value as compared to normal value. Explained to patient that HF is a chronic illness which requires self-assessment / self-management along with help from the cardiologist/PCP/HF Clinic.? ?? *Reviewed importance of and reason behind checking weight daily in the AM, after using the bathroom, but before getting dressed.?Patient has scales.   ? Reviewed the following information with patient:  *Discussed when to call the Dr= weight gain of >2-3lb overnight of 5lb in a week,  *Discussed yellow zone= call MD: weight gain of >2-3lb overnight of 5lb in a week, increased swelling, increased SOB when lying down, chest discomfort, dizziness, increased fatigue *Red Zone= call 911: struggle to breath, fainting or near fainting, significant chest pain   *Reviewed low sodium diet-provided handout of recommended and not recommended foods. ?Reviewed reading labels with patient. Discussed fluid intake / restriction with patient as well.  Dietitian Consultation entered for diet education.  Patient informed.    *Smoking Cessation - Never Smoker.   *Discussed the benefits of exercise.  Patient has been referred to Cardiac Rehab with the diagnosis of CHF.  Overview of the program provided.  Patient informed this RN that he does not currently have health insurance nor is he currently working.  Patient stated that he previously worked for a company for 28 years and that company closed.  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  Patient further  stated that he feels all of this financial strain has probably contributed to this illness.  Allowed patient to ventilate and  express his feelings.    This RN explained to patient the AHA guidelines for exercise. Encouraged walking and gradually increasing up to 30 minutes per day for a minimum of 150 Minutes per week per AHA guidelines. Patient feels like he will be able to walk to get his exercise.  This RN did explain to the patient that the order is good for a year if he should have health insurance in the future.  Cardiac Rehab brochure given to patient.   ? *[facility]- Explained the role of the [facility]. ?Explained to patient and wife that the HF Clinic does not replace his PCP/cardiologist, but is an additional resource to help him manage his HF and to keep him out of the hospital. Appointment in the HF Clinic is scheduled for 11/01/2017 at 9:40 a.m.  Patient agreeable to being followed in the [facility] Failure Clinic.   ? Again, the 5 Steps to Living Better with Heart Failure were reviewed with patient.   ? Army Melia, RN, BSN, [facility] Cardiovascular and Pulmonary Nurse Navigator

## 2017-10-29 NOTE — Progress Notes (Signed)
Nutrition Education Note  RD consulted for nutrition education regarding new onset CHF.  52 y.o. male with apparent history of hypertension but no cardiovascular history having acute systolic dysfunction congestive heart failure with apical thrombus normal coronary arteries by cardiac catheterization without evidence of myocardial infarction  RD provided "Low Sodium Nutrition Therapy" handout from the Academy of Nutrition and Dietetics. Reviewed patient's dietary recall. Provided examples on ways to decrease sodium intake in diet. Discouraged intake of processed foods and use of salt shaker. Encouraged fresh fruits and vegetables as well as whole grain sources of carbohydrates to maximize fiber intake.   RD discussed why it is important for patient to adhere to diet recommendations, and emphasized the role of fluids, foods to avoid, and importance of weighing self daily. Teach back method used.  Expect fair compliance.  Body mass index is 23.58 kg/m. Pt meets criteria for normal weight based on current BMI.  Current diet order is HH, patient is consuming approximately 100% of meals at this time. Labs and medications reviewed. No further nutrition interventions warranted at this time. RD contact information provided. If additional nutrition issues arise, please re-consult RD.   Betsey Holiday MS, RD, LDN Pager #- [phone] Office#- [phone] After Hours Pager: [phone]

## 2017-10-29 NOTE — Care Management (Signed)
Patient has been out of work since March when he "got let go" after 28 years. he does not have insurance.  He lives alone. Firmly states he does not have any issues with transportation.  does not have a PCP because he has not needed one.  Provided patient with [facility].  [facility] and [facility].  Informed patient and [facility] to schedule an eligibility appointment for continued assistance.  Patient does not verbalize anything about his findings other than his house is paid off.   Provided patient with Eliquis coupon for 30 days trial.  Patient verblaizes understanding to go directly accross the street to the clinic to obtain his meds as the clinic closes at 4:30 and not open weekends. Primary nurse verbalizes understanding of same

## 2017-10-29 NOTE — Progress Notes (Signed)
Patient ID: Austin Mckenzie, male    DOB: March 26, 1966, 52 y.o.   MRN: [id number]  HPI  Austin Mckenzie is a 52 y/o male with a history of HTN, LV apical thrombus, recent tobacco use and chronic heart failure.   Echo report from 10/28/17 reviewed and showed an EF of 15% along with mild Austin and a PA pressure of 31 mm Hg. Cardiac catheterization done 10/28/17 and showed an EF of 15% along with normal coronary arteries.   Admitted 10/27/17 due to acute HF. Initially needed IV lasix and then transitioned to oral diuretics. Cardiology consult obtained and catheterization done. Discharged after 2 days.   He presents today for his initial visit with a chief complaint of minimal shortness of breath upon moderate exertion. This has been present for several weeks and says that it's been improving. He has associated fatigue and easy bruising along with this. He denies any difficulty sleeping, abdominal distention, palpitations, edema, chest pain, dizziness or weight gain.   Past Medical History:  Diagnosis Date  . CHF (congestive heart failure) (HCC)   . Hypertension    Past Surgical History:  Procedure Laterality Date  . CARDIAC CATHETERIZATION    . LEFT HEART CATH AND CORONARY ANGIOGRAPHY N/A 10/28/2017   Procedure: LEFT HEART CATH AND CORONARY ANGIOGRAPHY;  Surgeon: Lamar Blinks, MD;  Location: [facility] INVASIVE CV LAB;  Service: Cardiovascular;  Laterality: N/A;   History reviewed. No pertinent family history. Social History   Tobacco Use  . Smoking status: Former Smoker    Packs/day: 1.00    Years: 24.00    Pack years: 24.00    Types: Cigarettes    Last attempt to quit: 11/02/2007    Years since quitting: 10.0  . Smokeless tobacco: Never Used  Substance Use Topics  . Alcohol use: Yes    Alcohol/week: 3.6 oz    Types: 6 Cans of beer per week   No Known Allergies   Prior to Admission medications   Medication Sig Start Date End Date Taking? Authorizing Provider  apixaban (ELIQUIS) 5 MG TABS tablet  Take 1 tablet (5 mg total) by mouth 2 (two) times daily. 10/29/17  Yes Enid Baas, MD  atorvastatin (LIPITOR) 40 MG tablet Take 1 tablet (40 mg total) by mouth daily at 6 PM. 10/29/17  Yes Enid Baas, MD  carvedilol (COREG) 12.5 MG tablet Take 1 tablet (12.5 mg total) by mouth 2 (two) times daily with a meal. 10/29/17  Yes Enid Baas, MD  furosemide (LASIX) 40 MG tablet Take 1 tablet (40 mg total) by mouth daily. 10/29/17 01/27/18 Yes Enid Baas, MD  lisinopril (PRINIVIL,ZESTRIL) 20 MG tablet Take 1 tablet (20 mg total) by mouth daily. 10/30/17  Yes Enid Baas, MD  potassium chloride SA (K-DUR,KLOR-CON) 20 MEQ tablet Take 1 tablet (20 mEq total) by mouth daily. While on lasix 10/29/17  Yes Enid Baas, MD  spironolactone (ALDACTONE) 25 MG tablet Take 1 tablet (25 mg total) by mouth daily. 10/30/17  Yes Enid Baas, MD   Review of Systems  Constitutional: Positive for fatigue. Negative for appetite change.  HENT: Negative for congestion, rhinorrhea and sore throat.   Eyes: Negative.   Respiratory: Positive for shortness of breath (minimal). Negative for chest tightness.   Cardiovascular: Negative for chest pain, palpitations and leg swelling.  Gastrointestinal: Negative for abdominal distention and abdominal pain.  Endocrine: Negative.   Genitourinary: Negative.   Musculoskeletal: Negative for back pain and neck pain.  Skin: Negative.   Allergic/Immunologic: Negative.  Neurological: Negative for dizziness and light-headedness.  Hematological: Negative for adenopathy. Bruises/bleeds easily.  Psychiatric/Behavioral: Negative for dysphoric mood and sleep disturbance (sleeping on 1 pillow). The patient is not nervous/anxious.     Vitals:   11/01/17 0956  BP: (!) 127/98  Pulse: 83  Resp: 18  SpO2: 100%  Weight: 162 lb 8 oz (73.7 kg)  Height:  (1.727 m)   Wt Readings from Last 3 Encounters:  11/01/17 162 lb 8 oz (73.7 kg)  10/29/17 155  lb 1.6 oz (70.4 kg)   Lab Results  Component Value Date   CREATININE 0.92 10/29/2017   CREATININE 1.05 10/28/2017   CREATININE 1.11 10/27/2017   Physical Exam  Constitutional: He is oriented to person, place, and time. He appears well-developed and well-nourished.  HENT:  Head: Normocephalic and atraumatic.  Neck: Normal range of motion. Neck supple. No thyromegaly present.  Cardiovascular: Normal rate and regular rhythm.  Pulmonary/Chest: Effort normal. No respiratory distress. He has no wheezes. He has no rales.  Abdominal: Soft. He exhibits no distension.  Musculoskeletal:       Right lower leg: He exhibits no tenderness and no edema.       Left lower leg: He exhibits no tenderness and no edema.  Neurological: He is alert and oriented to person, place, and time.  Skin: Skin is warm and dry.  Psychiatric: He has a normal mood and affect. His behavior is normal.  Nursing note and vitals reviewed.  Assessment & Plan:  1: Chronic heart failure with reduced ejection fraction- - NYHA class II - euvolemic today - weighing daily and says that his weight has been stable. Instructed to call for an overnight weight gain of >2 pounds or a weekly weight gain of >5 pounds - not adding salt and has started reading food labels. Reviewed the importance of closely following a  sodium diet and written dietary information was given to him about this - walking 3 days / week - will stop lisinopril today and begin entresto 24/26mg  twice daily on 11/03/17 - 30 day voucher given to patient - will check a BMP at his next visit as he'll be on entresto, potassium and spironolactone - BNP 10/27/17 was 1971.0 - PharmD reconciled medications with the patient  2: HTN- - BP mildly elevated; entresto being started above - currently uninsured so information for [facility] was given to patient along with charity care information - [facility] - BMP from 10/29/17 reviewed  and showed sodium 132, potassium 3.0 and GFR >60  3: Tobacco use- - no longer smoking - continued cessation discussed for 3 minutes with him  4: LV apical thrombus- - on apixaban  Patient did not bring his medications nor a list. Each medication was verbally reviewed with the patient and he was encouraged to bring the bottles to every visit to confirm accuracy of list.  Return in 2 weeks or sooner for any questions/problems before then.

## 2017-10-29 NOTE — Progress Notes (Signed)
RN ambulated patient around the nurses desk. Pts HR maintained in the 80s during ambulation. Pt remained in NSR with 1degree HB. I will continue to assess.

## 2017-10-29 NOTE — Progress Notes (Signed)
[facility] Encounter Note  Patient: Austin Mckenzie / Admit Date: 10/27/2017 / Date of Encounter: 10/29/2017, 8:25 AM   Subjective: Continued high blood pressure despite appropriate medication management.  Some abdominal discomfort still somewhat residual from admission although significantly improved from admission date Echocardiogram showing severe dilated cardiomyopathy with severe LV dysfunction with ejection fraction of 15% and 1 cm apical thrombus Cardiac catheterization with coronary arteries showing normal coronary arteries Patient has been started on anticoagulation without any complications or apical thrombus Review of Systems: Positive for: Abdominal discomfort and shortness of breath Negative for: Vision change, hearing change, syncope, dizziness, nausea, vomiting,diarrhea, bloody stool,  , cough, congestion, diaphoresis, urinary frequency, urinary pain,skin lesions, skin rashes Others previously listed  Objective: Telemetry: Normal sinus rhythm Physical Exam: Blood pressure (!) 137/102, pulse 95, temperature 98.6 F (37 C), temperature source Oral, resp. rate 18, height  (1.727 m), weight 155 lb 1.6 oz (70.4 kg), SpO2 93 %. Body mass index is 23.58 kg/m. General: Well developed, well nourished, in no acute distress. Head: Normocephalic, atraumatic, sclera non-icteric, no xanthomas, nares are without discharge. Neck: No apparent masses Lungs: Normal respirations with no wheezes, no rhonchi, no rales , basilar crackles   Heart: Regular rate and rhythm, normal S1 S2, no murmur, no rub, no gallop, PMI is normal size and placement, carotid upstroke normal without bruit, jugular venous pressure normal Abdomen: Soft,  tender,  distended with normoactive bowel sounds. No hepatosplenomegaly. Abdominal aorta is normal size without bruit Extremities: Trace to 1+ edema, no clubbing, no cyanosis, no ulcers,  Peripheral: 2+ radial, 2+ femoral, 2+ dorsal pedal  pulses Neuro: Alert and oriented. Moves all extremities spontaneously. Psych:  Responds to questions appropriately with a normal affect.   Intake/Output Summary (Last 24 hours) at 10/29/2017 0825 Last data filed at 10/29/2017 0308 Gross per 24 hour  Intake 1047.36 ml  Output 6750 ml  Net -5702.64 ml    Inpatient Medications:  . apixaban  5 mg Oral BID  . aspirin EC  325 mg Oral Daily  . atorvastatin  40 mg Oral q1800  . carvedilol  6.25 mg Oral BID WC  . docusate sodium  100 mg Oral BID  . furosemide  40 mg Intravenous Q12H  . lisinopril  10 mg Oral Daily  . [START ON 10/30/2017] potassium chloride  20 mEq Oral BID  . potassium chloride  40 mEq Oral Q4H  . spironolactone  25 mg Oral Daily   Infusions:  . magnesium sulfate 1 - 4 g bolus IVPB      Labs: Recent Labs    10/28/17 0722 10/29/17 0633  NA 137 132*  K 3.5 3.0*  CL 97* 92*  CO2 30 28  GLUCOSE 105* 106*  BUN 8 7  CREATININE 1.05 0.92  CALCIUM 8.5* 7.8*  MG  --  1.4*   Recent Labs    10/27/17 1647  AST 31  ALT 19  ALKPHOS 104  BILITOT 1.8*  PROT 7.5  ALBUMIN 4.2   Recent Labs    10/27/17 1647 10/28/17 0722  WBC 7.9 7.6  NEUTROABS 5.0  --   HGB 16.0 16.1  HCT 47.8 47.7  MCV 90.4 90.3  PLT 313 305   Recent Labs    10/27/17 2253  TROPONINI 0.24*   Invalid input(s): POCBNP No results for input(s): HGBA1C in the last 72 hours.   Weights: Filed Weights   10/28/17 0403 10/28/17 1203 10/29/17 0308  Weight: 165 lb 3.2 oz (74.9  kg) 165 lb (74.8 kg) 155 lb 1.6 oz (70.4 kg)     Radiology/Studies:  Dg Chest 2 View  Result Date: 10/27/2017 CLINICAL DATA:  Shortness of breath and abdominal pain EXAM: CHEST - 2 VIEW COMPARISON:  None. FINDINGS: Cardiomegaly with diffuse interstitial opacities, likely pulmonary interstitial edema. No pneumothorax. Small right pleural effusion. No focal airspace consolidation. IMPRESSION: Cardiomegaly and mild interstitial pulmonary edema. Electronically Signed   By:  Deatra Robinson M.D.   On: 10/27/2017 20:16   Ct Abdomen Pelvis W Contrast  Result Date: 10/27/2017 CLINICAL DATA:  Abdominal pain radiating to right side. Periumbilical pain. EXAM: CT ABDOMEN AND PELVIS WITH CONTRAST TECHNIQUE: Multidetector CT imaging of the abdomen and pelvis was performed using the standard protocol following bolus administration of intravenous contrast. CONTRAST:  75mL ISOVUE-370 IOPAMIDOL (ISOVUE-370) INJECTION 76% COMPARISON:  None. FINDINGS: LOWER CHEST: Small right pleural effusion with associated atelectasis. HEPATOBILIARY: Small volume perihepatic ascites. No discrete liver lesion. Normal gallbladder. PANCREAS: Normal parenchymal contours without ductal dilatation. No peripancreatic fluid collection. SPLEEN: Normal. ADRENALS/URINARY TRACT: --Adrenal glands: Normal. --Right kidney/ureter: No hydronephrosis, nephroureterolithiasis, perinephric stranding or solid renal mass. --Left kidney/ureter: No hydronephrosis, nephroureterolithiasis, perinephric stranding or solid renal mass. --Urinary bladder: There is diffuse urinary bladder wall thickening with surrounding inflammatory stranding in the extraperitoneal space. STOMACH/BOWEL: --Stomach/Duodenum: No hiatal hernia or other gastric abnormality. Normal duodenal course. --Small bowel: No dilatation or inflammation. --Colon: No focal abnormality. --Appendix: There is moderate volume fluid in the right paracolic gutter and right lower quadrant. The appendix is not clearly visible, but appears to be of normal caliber, though surrounded by fluid. No free intraperitoneal air. VASCULAR/LYMPHATIC: Atherosclerotic calcification is present within the non-aneurysmal abdominal aorta, without hemodynamically significant stenosis. No abdominal or pelvic lymphadenopathy. REPRODUCTIVE: No free fluid in the pelvis. MUSCULOSKELETAL. No bony spinal canal stenosis or focal osseous abnormality. T11 hemangioma. OTHER: None. IMPRESSION: 1. Moderate volume free  fluid throughout the abdomen, within the perihepatic space, perisplenic space and right-greater-than-left paracolic gutters. There is also retroperitoneal fluid and stranding. The etiology of the fluid is unclear. In the context of moderate thickening of the urinary bladder with surrounding inflammatory change, acute cystitis is a primary consideration. Hypoalbuminemia or chronic liver disease might also account for the intraperitoneal fluid. No focal small bowel or colonic abnormality to suggest gastrointestinal origin. There is a relatively dense stool ball in the rectum, but the surrounding perirectal fat is normal. 2. Small right pleural effusion. Electronically Signed   By: Deatra Robinson M.D.   On: 10/27/2017 19:45     Assessment and Recommendation  52 y.o. male with apparent history of hypertension but no cardiovascular history having acute systolic dysfunction congestive heart failure with apical thrombus normal coronary arteries by cardiac catheterization without evidence of myocardial infarction 1.  Continue aggressive medication management for hypertension control increasing carvedilol to maximum dose of 25 mg twice per day and advance to 12.5 mg twice per day at this time 2.  Continue advancing ACE inhibitor to goal dose of 20 mg each day but may advance during outpatient 3.  Added spironolactone 25 mg each day for cardiomyopathy and currently tolerating well 4.  Continue Eliquis 5 mg twice per day for anticoagulation and treatment of apical thrombus 5.  Further assessment and treatment of abdominal discomfort if not resolving  6.  Begin ambulation and follow for improvements of symptoms and need for change in medication management 7.  No further cardiac diagnostics necessary at this time  SignedJinny Blossom.D.  FACC

## 2017-10-29 NOTE — Discharge Summary (Signed)
[facility] [place] [facility]   PATIENT NAME: Austin Mckenzie    MR#:  [id number]  DATE OF BIRTH:  18-Jan-1966  DATE OF ADMISSION:  10/27/2017   ADMITTING PHYSICIAN: Cammy Copa, MD  DATE OF DISCHARGE:  10/29/17  PRIMARY CARE PHYSICIAN: Patient, No Pcp Per   ADMISSION DIAGNOSIS:   Acute pulmonary edema (HCC) [J81.0] Other ascites [R18.8]  DISCHARGE DIAGNOSIS:   Active Problems:   Acute exacerbation of CHF (congestive heart failure) (HCC)   SECONDARY DIAGNOSIS:   History reviewed. No pertinent past medical history.  HOSPITAL COURSE:   52 year old male with no known past medical history presents to hospital secondary to worsening dyspnea and abdominal distention going on for 2 weeks now.  1.  Acute congestive heart failure-no known prior history. -Dilated nonischemic cardiomyopathy.  Echocardiogram showing EF of 15%, cardiac catheterization confirmed no occlusive coronary artery disease. -Appreciate cardiology consult.  Diuresed with IV Lasix with significant improvement in his symptoms. -Started on carvedilol, Lasix, lisinopril.  Sherryll Burger can be started as outpatient -Also on statin  2.    LV apical thrombus-noted on echocardiogram.  Started on Eliquis per cardiology recommendations  3.  Ascites-liver morphology appears normal.    Secondary to congestive heart failure -Much improved with IV Lasix.  No indication for paracentesis at this time.  Can be followed up as outpatient  4.  Hypertension-improved with high doses of Coreg, lisinopril and also on Lasix    Patient is ambulatory - heart rate is stable.  No other acute symptoms.  Will be discharged home today.  Needs to follow-up with cardiology as outpatient    DISCHARGE CONDITIONS:   Guarded  CONSULTS OBTAINED:   Treatment Team:  Lamar Blinks, MD  DRUG ALLERGIES:   No Known Allergies DISCHARGE MEDICATIONS:   Allergies as of 10/29/2017   No Known Allergies       Medication List    TAKE these medications   apixaban 5 MG Tabs tablet Commonly known as:  ELIQUIS Take 1 tablet (5 mg total) by mouth 2 (two) times daily.   atorvastatin 40 MG tablet Commonly known as:  LIPITOR Take 1 tablet (40 mg total) by mouth daily at 6 PM.   carvedilol 12.5 MG tablet Commonly known as:  COREG Take 1 tablet (12.5 mg total) by mouth 2 (two) times daily with a meal.   furosemide 40 MG tablet Commonly known as:  LASIX Take 1 tablet (40 mg total) by mouth daily.   lisinopril 20 MG tablet Commonly known as:  PRINIVIL,ZESTRIL Take 1 tablet (20 mg total) by mouth daily. Start taking on:  10/30/2017   potassium chloride SA 20 MEQ tablet Commonly known as:  K-DUR,KLOR-CON Take 1 tablet (20 mEq total) by mouth daily. While on lasix   spironolactone 25 MG tablet Commonly known as:  ALDACTONE Take 1 tablet (25 mg total) by mouth daily. Start taking on:  10/30/2017        DISCHARGE INSTRUCTIONS:   1.  PCP follow-up in 1-2 weeks 2.  Cardiology follow-up in 1 week  DIET:   Cardiac diet  ACTIVITY:   Activity as tolerated  OXYGEN:   Home Oxygen: No.  Oxygen Delivery: room air  DISCHARGE LOCATION:   home   If you experience worsening of your admission symptoms, develop shortness of breath, life threatening emergency, suicidal or homicidal thoughts you must seek medical attention immediately by calling 911 or calling your MD immediately  if symptoms less severe.  You  Must read complete instructions/literature along with all the possible adverse reactions/side effects for all the Medicines you take and that have been prescribed to you. Take any new Medicines after you have completely understood and accpet all the possible adverse reactions/side effects.   Please note  You were cared for by a hospitalist during your hospital stay. If you have any questions about your discharge medications or the care you received while you were in the hospital after  you are discharged, you can call the unit and asked to speak with the hospitalist on call if the hospitalist that took care of you is not available. Once you are discharged, your primary care physician will handle any further medical issues. Please note that NO REFILLS for any discharge medications will be authorized once you are discharged, as it is imperative that you return to your primary care physician (or establish a relationship with a primary care physician if you do not have one) for your aftercare needs so that they can reassess your need for medications and monitor your lab values.    On the day of Discharge:  VITAL SIGNS:   Blood pressure (!) 137/102, pulse 95, temperature 98.6 F (37 C), temperature source Oral, resp. rate 18, height  (1.727 m), weight 70.4 kg (155 lb 1.6 oz), SpO2 93 %.  PHYSICAL EXAMINATION:    GENERAL:  52 y.o.-year-old patient lying in the bed with no acute distress.  EYES: Pupils equal, round, reactive to light and accommodation. No scleral icterus. Extraocular muscles intact.  HEENT: Head atraumatic, normocephalic. Oropharynx and nasopharynx clear.  NECK:  Supple, no jugular venous distention. No thyroid enlargement, no tenderness.  LUNGS: Normal breath sounds bilaterally, no wheezing, rales,rhonchi or crepitation. No use of accessory muscles of respiration.  Decreased bibasilar breath sounds, fine bibasilar crackles. CARDIOVASCULAR: S1, S2 regular . No murmurs, rubs, or gallops.  ABDOMEN: Soft, nontender, slightly distended. Much improved. Bowel sounds present. No organomegaly or mass.  EXTREMITIES: No pedal edema, cyanosis, or clubbing.  NEUROLOGIC: Cranial nerves II through XII are intact. Muscle strength 5/5 in all extremities. Sensation intact. Gait not checked.  PSYCHIATRIC: The patient is alert and oriented x 3.  SKIN: No obvious rash, lesion, or ulcer.      DATA REVIEW:   CBC Recent Labs  Lab 10/28/17 0722  WBC 7.6  HGB 16.1  HCT  47.7  PLT 305    Chemistries  Recent Labs  Lab 10/27/17 1647  10/29/17 0633  NA 133*   < > 132*  K 4.2   < > 3.0*  CL 97*   < > 92*  CO2 28   < > 28  GLUCOSE 116*   < > 106*  BUN 7   < > 7  CREATININE 1.11   < > 0.92  CALCIUM 8.6*   < > 7.8*  MG  --   --  1.4*  AST 31  --   --   ALT 19  --   --   ALKPHOS 104  --   --   BILITOT 1.8*  --   --    < > = values in this interval not displayed.     Microbiology Results  No results found for this or any previous visit.  RADIOLOGY:  No results found.   Management plans discussed with the patient, family and they are in agreement.  CODE STATUS:     Code Status Orders  (From admission, onward)  Start     Ordered   10/28/17 0039  Full code  Continuous     10/28/17 0038    Code Status History    This patient has a current code status but no historical code status.      TOTAL TIME TAKING CARE OF THIS PATIENT: 38 minutes.    Leverne Amrhein M.D on 10/29/2017 at 3:00 PM  Between 7am to 6pm - Pager - [phone]  After 6pm go to [email or web address] - Social research officer, government  [facility] [place] [facility]  Office  [phone]  CC: Primary care physician; Patient, No Pcp Per   Note: This dictation was prepared with Dragon dictation along with smaller phrase technology. Any transcriptional errors that result from this process are unintentional.

## 2017-11-01 ENCOUNTER — Encounter: Payer: Self-pay | Admitting: Family

## 2017-11-01 ENCOUNTER — Ambulatory Visit: Payer: Self-pay | Attending: Family | Admitting: Family

## 2017-11-01 DIAGNOSIS — Z7901 Long term (current) use of anticoagulants: Secondary | ICD-10-CM | POA: Insufficient documentation

## 2017-11-01 DIAGNOSIS — Z720 Tobacco use: Secondary | ICD-10-CM | POA: Insufficient documentation

## 2017-11-01 DIAGNOSIS — I5022 Chronic systolic (congestive) heart failure: Secondary | ICD-10-CM | POA: Insufficient documentation

## 2017-11-01 DIAGNOSIS — Z86718 Personal history of other venous thrombosis and embolism: Secondary | ICD-10-CM | POA: Insufficient documentation

## 2017-11-01 DIAGNOSIS — I10 Essential (primary) hypertension: Secondary | ICD-10-CM

## 2017-11-01 DIAGNOSIS — I110 Hypertensive heart disease with heart failure: Secondary | ICD-10-CM | POA: Insufficient documentation

## 2017-11-01 DIAGNOSIS — Z87891 Personal history of nicotine dependence: Secondary | ICD-10-CM | POA: Insufficient documentation

## 2017-11-01 DIAGNOSIS — I513 Intracardiac thrombosis, not elsewhere classified: Secondary | ICD-10-CM

## 2017-11-01 DIAGNOSIS — Z79899 Other long term (current) drug therapy: Secondary | ICD-10-CM | POA: Insufficient documentation

## 2017-11-01 MED ORDER — SACUBITRIL-VALSARTAN 24-26 MG PO TABS: 1.0000 | ORAL_TABLET | Freq: Two times a day (BID) | ORAL | 3 refills | Status: AC

## 2017-11-01 NOTE — Patient Instructions (Addendum)
Continue weighing daily and call for an overnight weight gain of > 2 pounds or a weekly weight gain of >5 pounds.  Do not take anymore lisinopril. On Wednesday, you will begin entresto 24/26mg  twice daily.    [facility]   [phone]  [address] [place] [address]  Hours: Tuesday: 4:15 pm - 7:30 pm  Wednesday: 9 am - 1 pm  Thursday: 1 pm - 7:30 pm  Friday - Monday: CLOSED

## 2017-11-12 ENCOUNTER — Ambulatory Visit: Payer: Self-pay | Admitting: Family

## 2017-11-18 ENCOUNTER — Telehealth: Payer: Self-pay | Admitting: Family

## 2017-11-18 ENCOUNTER — Ambulatory Visit: Payer: Self-pay | Admitting: Family

## 2017-11-18 NOTE — Telephone Encounter (Signed)
Patient did not show for his [facility] appointment on 11/18/17. Will attempt to reschedule.

## 2020-02-02 IMAGING — CT CT ABD-PELV W/ CM
2 of 5 series · 15 of 46 positions shown, 17 images · IV contrast (APPLIED)
Comparison: None.

CLINICAL DATA: Abdominal pain radiating to right side.
Periumbilical pain.

EXAM:
CT ABDOMEN AND PELVIS WITH CONTRAST
TECHNIQUE: Multidetector CT imaging of the abdomen and pelvis was performed
using the standard protocol following bolus administration of
intravenous contrast.
CONTRAST:  75mL MQDGJ7-[id number] IOPAMIDOL (MQDGJ7-RQG) INJECTION 76%

[Series 2: routine abd/pel with · axial · 0.81mm/px · z∈[-1068,-628]mm · 12 of 100 slices shown, 14 images]
[im 6/100  soft-tissue]
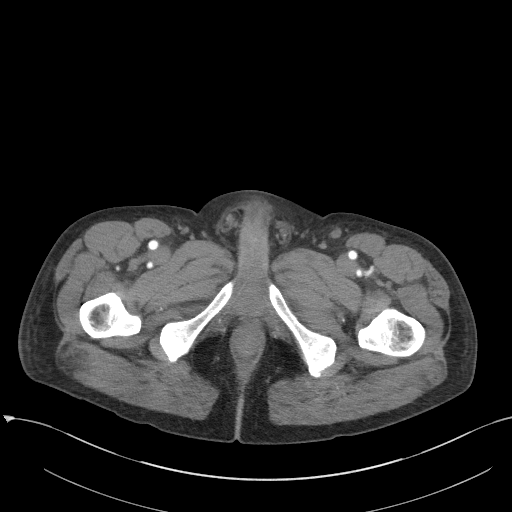
[im 6/100  bone]
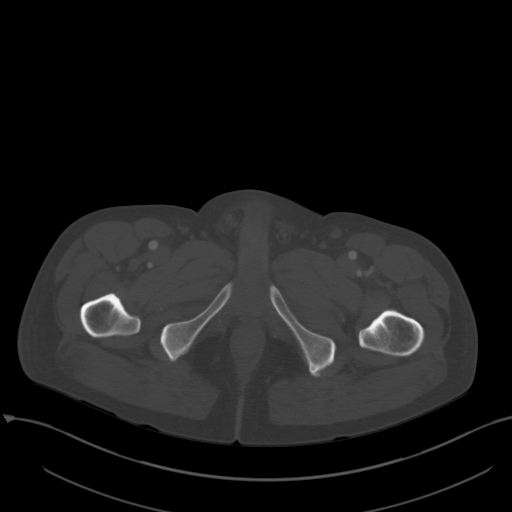
[im 16/100  soft-tissue]
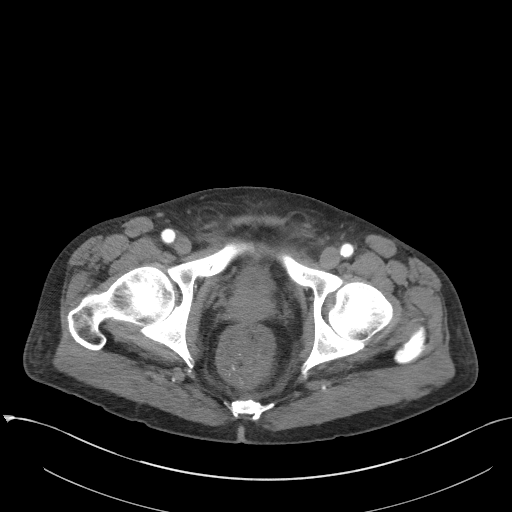
[im 21/100  soft-tissue]
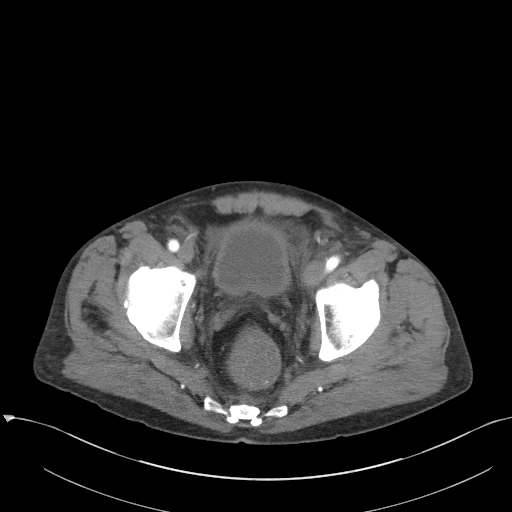
[im 32/100  soft-tissue]
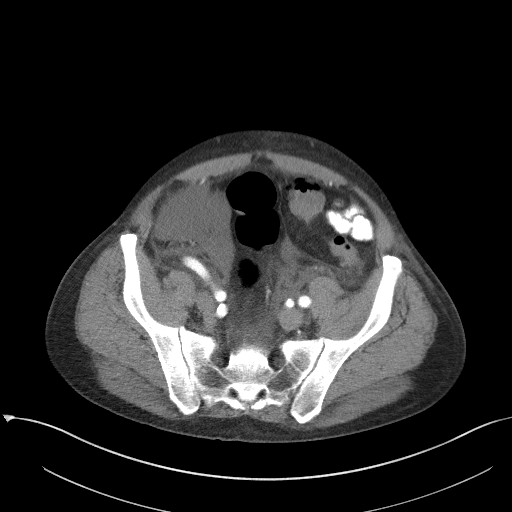
[im 37/100  soft-tissue]
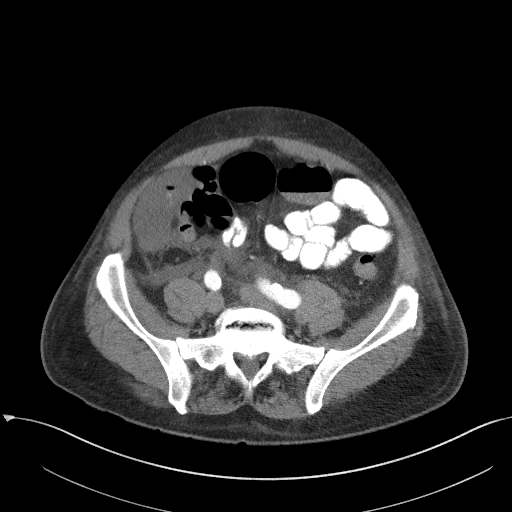
[im 47/100  soft-tissue]
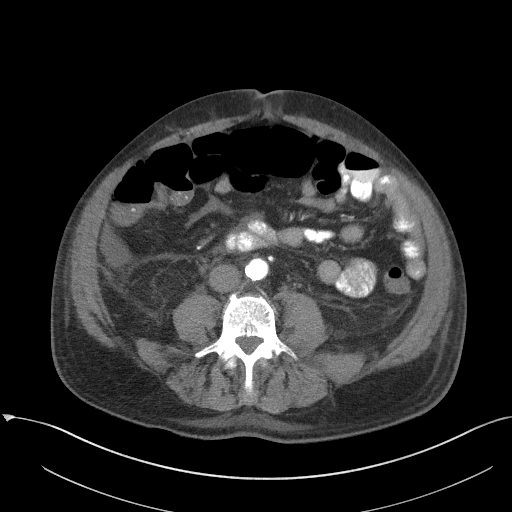
[im 53/100  soft-tissue]
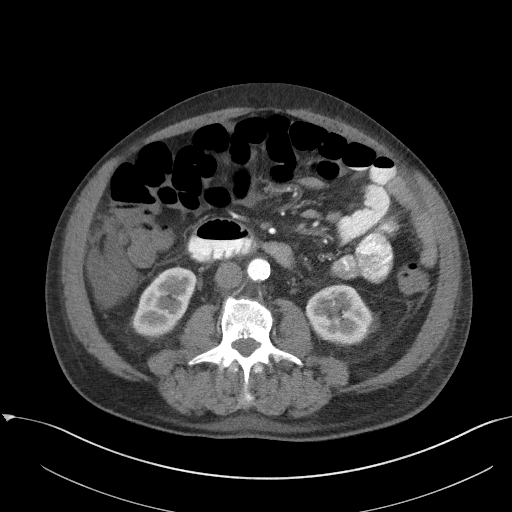
[im 63/100  soft-tissue]
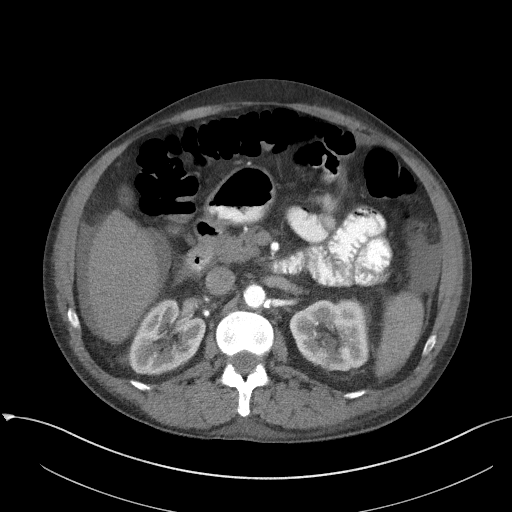
[im 68/100  soft-tissue]
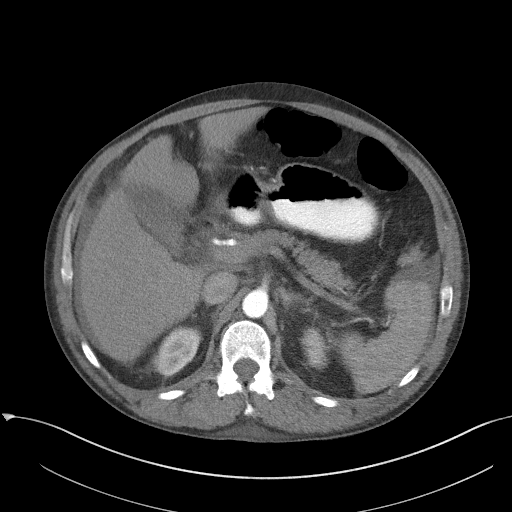
[im 68/100  bone]
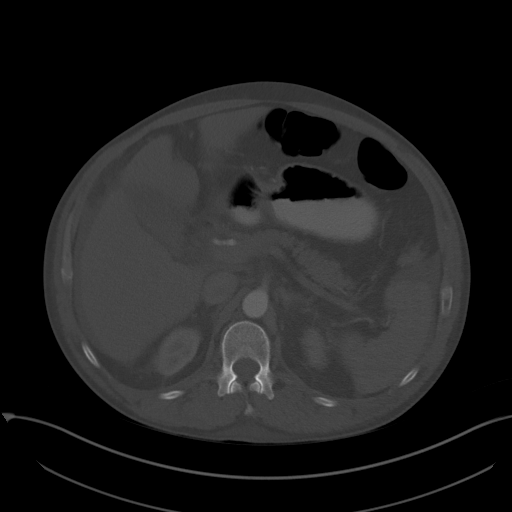
[im 79/100  soft-tissue]
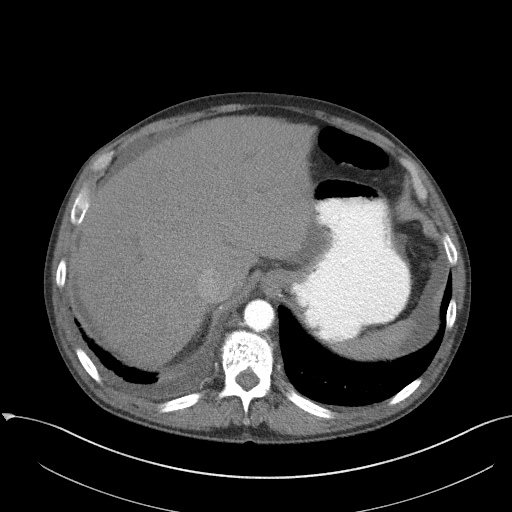
[im 84/100  soft-tissue]
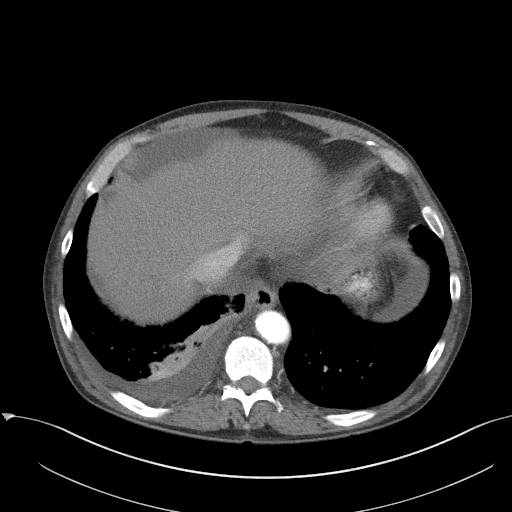
[im 94/100  soft-tissue]
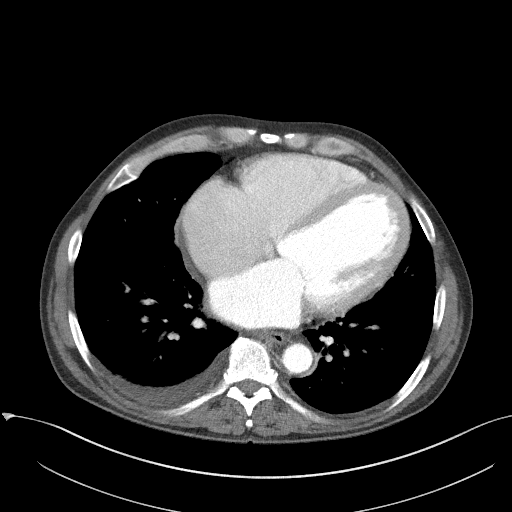

[Series 5: coronal st · coronal · 0.73mm/px · 3 of 96 slices shown]
[im 32/96  soft-tissue]
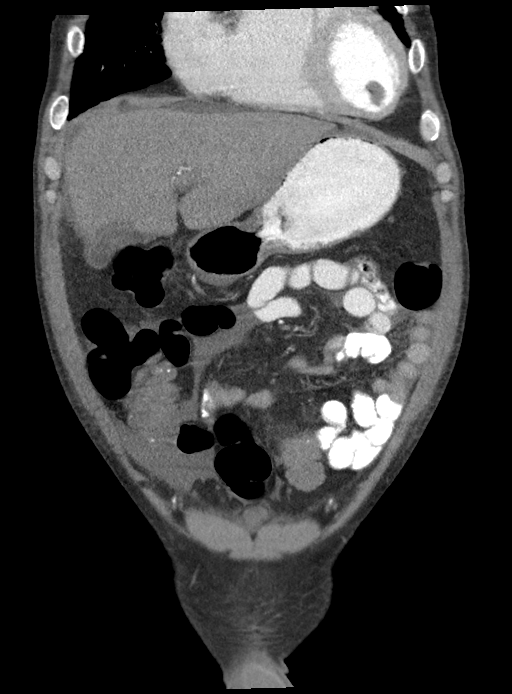
[im 43/96  soft-tissue]
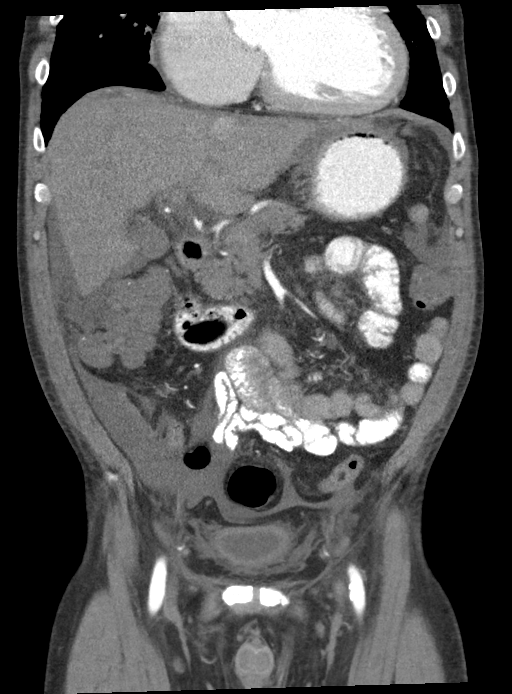
[im 53/96  soft-tissue]
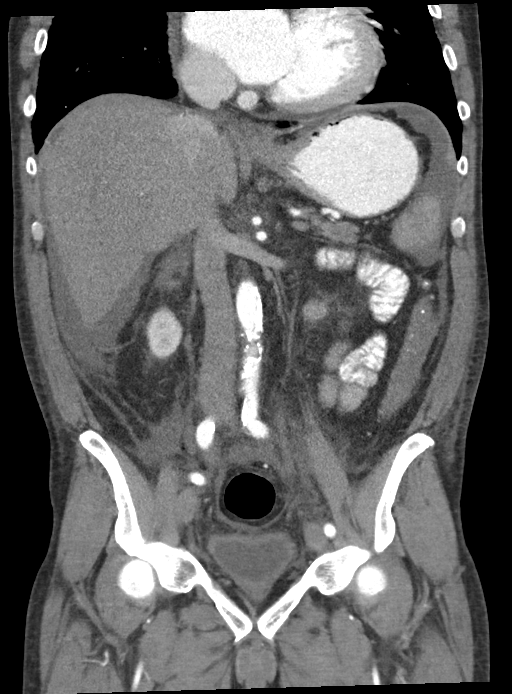

[15 of 46 positions shown; findings below may reference images not displayed]

FINDINGS: LOWER CHEST: Small right pleural effusion with associated
atelectasis.

HEPATOBILIARY: Small volume perihepatic ascites. No discrete liver
lesion. Normal gallbladder.

PANCREAS: Normal parenchymal contours without ductal dilatation. No
peripancreatic fluid collection.

SPLEEN: Normal.

ADRENALS/URINARY TRACT:

--Adrenal glands: Normal.

--Right kidney/ureter: No hydronephrosis, nephroureterolithiasis,
perinephric stranding or solid renal mass.

--Left kidney/ureter: No hydronephrosis, nephroureterolithiasis,
perinephric stranding or solid renal mass.

--Urinary bladder: There is diffuse urinary bladder wall thickening
with surrounding inflammatory stranding in the extraperitoneal
space.

STOMACH/BOWEL:

--Stomach/Duodenum: No hiatal hernia or other gastric abnormality.
Normal duodenal course.

--Small bowel: No dilatation or inflammation.

--Colon: No focal abnormality.

--Appendix: There is moderate volume fluid in the right paracolic
gutter and right lower quadrant. The appendix is not clearly
visible, but appears to be of normal caliber, though surrounded by
fluid. No free intraperitoneal air.

VASCULAR/LYMPHATIC: Atherosclerotic calcification is present within
the non-aneurysmal abdominal aorta, without hemodynamically
significant stenosis. No abdominal or pelvic lymphadenopathy.

REPRODUCTIVE: No free fluid in the pelvis.

MUSCULOSKELETAL. No bony spinal canal stenosis or focal osseous
abnormality. T11 hemangioma.

OTHER: None.
IMPRESSION: 1. Moderate volume free fluid throughout the abdomen, within the
perihepatic space, perisplenic space and right-greater-than-left
paracolic gutters. There is also retroperitoneal fluid and
stranding. The etiology of the fluid is unclear. In the context of
moderate thickening of the urinary bladder with surrounding
inflammatory change, acute cystitis is a primary consideration.
Hypoalbuminemia or chronic liver disease might also account for the
intraperitoneal fluid. No focal small bowel or colonic abnormality
to suggest gastrointestinal origin. There is a relatively dense
stool ball in the rectum, but the surrounding perirectal fat is
normal.
2. Small right pleural effusion.

## 2020-09-20 DEATH — deceased
